# Patient Record
Sex: Female | Born: 1985 | Race: White | Hispanic: No | Marital: Married | Smoking: Never smoker
Health system: Southern US, Community
[De-identification: ages and names within clinical notes are randomized; demographics above are authoritative.]

## PROBLEM LIST (undated history)

## (undated) ENCOUNTER — Inpatient Hospital Stay: Payer: Self-pay

## (undated) DIAGNOSIS — Z789 Other specified health status: Secondary | ICD-10-CM

## (undated) HISTORY — PX: NO PAST SURGERIES: SHX2092

## (undated) HISTORY — DX: Other specified health status: Z78.9

---

## 2007-10-18 ENCOUNTER — Ambulatory Visit: Payer: Self-pay | Admitting: Pediatrics

## 2009-03-26 DIAGNOSIS — D239 Other benign neoplasm of skin, unspecified: Secondary | ICD-10-CM

## 2009-03-26 HISTORY — DX: Other benign neoplasm of skin, unspecified: D23.9

## 2012-12-26 ENCOUNTER — Inpatient Hospital Stay: Payer: Self-pay | Admitting: Obstetrics and Gynecology

## 2012-12-26 LAB — CBC WITH DIFFERENTIAL/PLATELET
Basophil #: 0 10*3/uL (ref 0.0–0.1)
Basophil %: 0.2 %
Eosinophil #: 0 10*3/uL (ref 0.0–0.7)
Eosinophil %: 0.3 %
HCT: 32.5 % — ABNORMAL LOW (ref 35.0–47.0)
Lymphocyte %: 11.3 %
MCHC: 34.2 g/dL (ref 32.0–36.0)
Monocyte #: 0.4 x10 3/mm (ref 0.2–0.9)
Monocyte %: 3.7 %
Neutrophil #: 8.6 10*3/uL — ABNORMAL HIGH (ref 1.4–6.5)
RBC: 3.79 10*6/uL — ABNORMAL LOW (ref 3.80–5.20)
RDW: 15.5 % — ABNORMAL HIGH (ref 11.5–14.5)
WBC: 10.2 10*3/uL (ref 3.6–11.0)

## 2012-12-26 LAB — GC/CHLAMYDIA PROBE AMP

## 2012-12-28 LAB — HEMATOCRIT: HCT: 33.5 % — ABNORMAL LOW (ref 35.0–47.0)

## 2014-11-20 NOTE — H&P (Signed)
L&D Evaluation:  History:  HPI 29 yo G1 at [redacted]w[redacted]d by [facility] of 12/22/2012 presenting to clinic with SROM, clear, at 10:30 AM this morning.  GBS negative.  +FM, no VB, mild ctx. CVX in clinic 2/75/-2  A+ / ABSC neg / RI / VZI / HBsAg neg / RPR NR / HIV neg / 1-hr OGTT 117 / GBS neg  TDAP 10/31/12   Patient's Medical History Tonsillectomy   Patient's Surgical History none   Medications Pre Natal Vitamins   Allergies Augmentin (hives), Ceclor (hives)   Social History none   Family History Non-Contributory   ROS:  ROS All systems were reviewed.  HEENT, CNS, GI, GU, Respiratory, CV, Renal and Musculoskeletal systems were found to be normal.   Exam:  Vital Signs stable   Urine Protein not completed   General no apparent distress   Mental Status clear   Chest no increased work o breathing   Abdomen gravid, non-tender   Estimated Fetal Weight Average for gestational age   Fetal Position vtx   Back no CVAT   Edema no edema   Pelvic no external lesions, 2/75/-2 in clinic   Mebranes Ruptured   Description clear   FHT normal rate with no decels   Ucx irregular   Impression:  Impression active labor   Plan:  Plan monitor contractions and for cervical change   Comments - if no increase in contractions or cervical change will start pitocin augmentation   Electronic Signatures: Dorthula Nettles (MD)  (Signed 16-Jun-14 13:28)  Authored: L&D Evaluation   Last Updated: 16-Jun-14 13:28 by Dorthula Nettles (MD)

## 2015-07-14 NOTE — L&D Delivery Note (Addendum)
Delivery Note At 12:06 PM a viable female was delivered via Vaginal, Spontaneous Delivery (Presentation:OP;ROP).   APGAR: 8,8; weight: 3470. Placenta status: Intact, Spontaneous.  Cord:  with the following complications: none.  Cord pH: NA  Called to see patient.  Mom pushed to delivery viable female infant.  The head followed by shoulders, which delivered without difficulty, and the rest of the body.  No nuchal cord noted.  Baby to mom's chest.  Cord clamped and cut after > 5 min delay.  No cord blood obtained.  Placenta delivered spontaneously, intact, with a 3-vessel cord.  Perineum intact.  All counts correct.  Hemostasis obtained with IV pitocin and fundal massage. EBL 350 mL.    Anesthesia: Epidural  Episiotomy:  none Lacerations:  none Suture Repair: NA Est. Blood Loss (mL):  350  Mom to postpartum.  Baby to Couplet care / Skin to Skin.   Rod Can, CNM

## 2015-12-23 ENCOUNTER — Observation Stay
Admission: EM | Admit: 2015-12-23 | Discharge: 2015-12-23 | Disposition: A | Discharge status: Home / Self Care | Payer: BC Managed Care – PPO | Attending: Obstetrics & Gynecology | Admitting: Obstetrics & Gynecology

## 2015-12-23 DIAGNOSIS — Z3A39 39 weeks gestation of pregnancy: Secondary | ICD-10-CM | POA: Insufficient documentation

## 2015-12-23 DIAGNOSIS — O163 Unspecified maternal hypertension, third trimester: Secondary | ICD-10-CM | POA: Diagnosis not present

## 2015-12-23 DIAGNOSIS — O169 Unspecified maternal hypertension, unspecified trimester: Secondary | ICD-10-CM | POA: Diagnosis present

## 2015-12-23 LAB — COMPREHENSIVE METABOLIC PANEL
ALBUMIN: 3.1 g/dL — AB (ref 3.5–5.0)
ALK PHOS: 114 U/L (ref 38–126)
ALT: 15 U/L (ref 14–54)
ANION GAP: 9 (ref 5–15)
AST: 21 U/L (ref 15–41)
BILIRUBIN TOTAL: 0.5 mg/dL (ref 0.3–1.2)
BUN: 9 mg/dL (ref 6–20)
CO2: 19 mmol/L — AB (ref 22–32)
Calcium: 9.1 mg/dL (ref 8.9–10.3)
Chloride: 107 mmol/L (ref 101–111)
Creatinine, Ser: 0.61 mg/dL (ref 0.44–1.00)
GFR calc non Af Amer: >60 mL/min (ref >60)
GLUCOSE: 94 mg/dL (ref 65–99)
Potassium: 3.8 mmol/L (ref 3.5–5.1)
SODIUM: 135 mmol/L (ref 135–145)
Total Protein: 6.1 g/dL — ABNORMAL LOW (ref 6.5–8.1)

## 2015-12-23 LAB — TYPE AND SCREEN
ABO/RH(D): A POS
ANTIBODY SCREEN: NEGATIVE

## 2015-12-23 LAB — PROTEIN / CREATININE RATIO, URINE
Creatinine, Urine: 117 mg/dL
Protein Creatinine Ratio: 0.1 mg/mg{Cre} (ref 0.00–0.15)
TOTAL PROTEIN, URINE: 12 mg/dL

## 2015-12-23 LAB — CBC
HEMATOCRIT: 32.9 % — AB (ref 35.0–47.0)
HEMOGLOBIN: 11.3 g/dL — AB (ref 12.0–16.0)
MCH: 30.3 pg (ref 26.0–34.0)
MCHC: 34.4 g/dL (ref 32.0–36.0)
MCV: 88.1 fL (ref 80.0–100.0)
Platelets: 251 10*3/uL (ref 150–440)
RBC: 3.73 MIL/uL — AB (ref 3.80–5.20)
RDW: 14.2 % (ref 11.5–14.5)
WBC: 10.8 10*3/uL (ref 3.6–11.0)

## 2015-12-23 MED ORDER — ACETAMINOPHEN 325 MG PO TABS: 650.0000 mg | ORAL_TABLET | ORAL | Status: DC | PRN

## 2015-12-23 MED ORDER — ONDANSETRON HCL 4 MG/2ML IJ SOLN: 4.0000 mg | Freq: Four times a day (QID) | INTRAMUSCULAR | Status: DC | PRN

## 2015-12-23 NOTE — OB Triage Note (Signed)
Kathleen Marsh sent over from office for pre-eclampsia evaluation.

## 2015-12-23 NOTE — Discharge Summary (Signed)
See FPN

## 2015-12-23 NOTE — Discharge Instructions (Signed)

## 2015-12-23 NOTE — Final Progress Note (Signed)
Physician Final Progress Note  Patient ID: EWA KOT MRN: [id number] DOB/AGE: [birth year] 28 y.o.  Admit date: 12/23/2015 Admitting provider: Gae Dry, MD Discharge date: 12/23/2015  Admission Diagnoses: Elevated BP in office  Discharge Diagnoses:  Principal Problem:   Hypertension affecting pregnancy    Consults: None  Significant Findings/ Diagnostic Studies: 30 yo G1 at 39 weeks with elevated BP in office (140/88); she reports occas headache (mild treated well w Tylenol and rest) and spotty vision at times.  No CP, SOB, epig pain, or edema.  Today was last day at work [employer]), prior to office visit.  No s/sx labor.  AF, BP 115/68, 120s/70s at most.   Chest clear   Heart reg   Abd gravid NT ND   Extr no edema   SVE 1-2/80/-3   FHT 140s  Labs- no findings c/w preclampsia  Procedures: A NST procedure was performed with FHR monitoring and a normal baseline established, appropriate time of 20-40 minutes of evaluation, and accels >2 seen w 15x15 characteristics.  Results show a REACTIVE NST.   Discharge Condition: good  Disposition:  No signs of preclampsia; labile BP elevation due to work/other factors Monitor for sx's of preclampsia; labor signs counseled as well.   Diet: Regular diet  Discharge Activity: Activity as tolerated     Medication List    Notice    You have not been prescribed any medications.       Total time spent taking care of this patient: 15 minutes  Signed: Hoyt Koch 12/23/2015, 3:26 PM

## 2015-12-27 ENCOUNTER — Inpatient Hospital Stay: Payer: BC Managed Care – PPO | Admitting: Certified Registered Nurse Anesthetist

## 2015-12-27 ENCOUNTER — Inpatient Hospital Stay
Admission: EM | Admit: 2015-12-27 | Discharge: 2015-12-28 | DRG: 775 | Disposition: A | Discharge status: Home / Self Care | Payer: BC Managed Care – PPO | Attending: Advanced Practice Midwife | Admitting: Advanced Practice Midwife

## 2015-12-27 DIAGNOSIS — O4290 Premature rupture of membranes, unspecified as to length of time between rupture and onset of labor, unspecified weeks of gestation: Secondary | ICD-10-CM | POA: Diagnosis present

## 2015-12-27 DIAGNOSIS — Z888 Allergy status to other drugs, medicaments and biological substances status: Secondary | ICD-10-CM

## 2015-12-27 DIAGNOSIS — O4202 Full-term premature rupture of membranes, onset of labor within 24 hours of rupture: Secondary | ICD-10-CM | POA: Diagnosis present

## 2015-12-27 DIAGNOSIS — Z3A40 40 weeks gestation of pregnancy: Secondary | ICD-10-CM | POA: Diagnosis not present

## 2015-12-27 LAB — CBC
HEMATOCRIT: 33.7 % — AB (ref 35.0–47.0)
Hemoglobin: 11.6 g/dL — ABNORMAL LOW (ref 12.0–16.0)
MCH: 30.6 pg (ref 26.0–34.0)
MCHC: 34.5 g/dL (ref 32.0–36.0)
MCV: 88.7 fL (ref 80.0–100.0)
Platelets: 253 10*3/uL (ref 150–440)
RBC: 3.79 MIL/uL — AB (ref 3.80–5.20)
RDW: 14.3 % (ref 11.5–14.5)
WBC: 11 10*3/uL (ref 3.6–11.0)

## 2015-12-27 LAB — TYPE AND SCREEN
ABO/RH(D): A POS
Antibody Screen: NEGATIVE

## 2015-12-27 MED ORDER — OXYTOCIN BOLUS FROM INFUSION: 500.0000 mL | INTRAVENOUS | Status: DC

## 2015-12-27 MED ORDER — SENNOSIDES-DOCUSATE SODIUM 8.6-50 MG PO TABS
2.0000 | ORAL_TABLET | ORAL | Status: DC
  Administered 2015-12-27: 2 via ORAL
  Filled 2015-12-27: qty 2

## 2015-12-27 MED ORDER — SOD CITRATE-CITRIC ACID 500-334 MG/5ML PO SOLN: 30.0000 mL | ORAL | Status: DC | PRN

## 2015-12-27 MED ORDER — OXYTOCIN 10 UNIT/ML IJ SOLN
INTRAMUSCULAR | Status: AC
  Filled 2015-12-27: qty 2

## 2015-12-27 MED ORDER — DIPHENHYDRAMINE HCL 25 MG PO CAPS: 25.0000 mg | ORAL_CAPSULE | Freq: Four times a day (QID) | ORAL | Status: DC | PRN

## 2015-12-27 MED ORDER — OXYTOCIN 40 UNITS IN LACTATED RINGERS INFUSION - SIMPLE MED
1.0000 m[IU]/min | INTRAVENOUS | Status: DC
  Administered 2015-12-27: 2 m[IU]/min via INTRAVENOUS

## 2015-12-27 MED ORDER — LIDOCAINE HCL (PF) 1 % IJ SOLN: 30.0000 mL | INTRAMUSCULAR | Status: DC | PRN

## 2015-12-27 MED ORDER — BUTORPHANOL TARTRATE 1 MG/ML IJ SOLN: 1.0000 mg | INTRAMUSCULAR | Status: DC | PRN

## 2015-12-27 MED ORDER — LIDOCAINE-EPINEPHRINE (PF) 1.5 %-1:200000 IJ SOLN
INTRAMUSCULAR | Status: DC | PRN
  Administered 2015-12-27: 3 mL via EPIDURAL

## 2015-12-27 MED ORDER — ONDANSETRON HCL 4 MG PO TABS: 4.0000 mg | ORAL_TABLET | ORAL | Status: DC | PRN

## 2015-12-27 MED ORDER — COCONUT OIL OIL: 1.0000 "application " | TOPICAL_OIL | Status: DC | PRN

## 2015-12-27 MED ORDER — DIBUCAINE 1 % RE OINT: 1.0000 "application " | TOPICAL_OINTMENT | RECTAL | Status: DC | PRN

## 2015-12-27 MED ORDER — EPHEDRINE 5 MG/ML INJ
10.0000 mg | INTRAVENOUS | Status: DC | PRN
Start: 2015-12-27 — End: 2015-12-27
  Filled 2015-12-27: qty 2

## 2015-12-27 MED ORDER — BUPIVACAINE HCL (PF) 0.25 % IJ SOLN
INTRAMUSCULAR | Status: DC | PRN
  Administered 2015-12-27 (×2): 4 mL via EPIDURAL

## 2015-12-27 MED ORDER — MISOPROSTOL 200 MCG PO TABS
ORAL_TABLET | ORAL | Status: AC
  Filled 2015-12-27: qty 4

## 2015-12-27 MED ORDER — LACTATED RINGERS IV SOLN
500.0000 mL | INTRAVENOUS | Status: DC | PRN
  Administered 2015-12-27: 500 mL via INTRAVENOUS

## 2015-12-27 MED ORDER — LIDOCAINE HCL (PF) 1 % IJ SOLN
INTRAMUSCULAR | Status: AC
  Filled 2015-12-27: qty 30

## 2015-12-27 MED ORDER — FENTANYL 2.5 MCG/ML W/ROPIVACAINE 0.2% IN NS 100 ML EPIDURAL INFUSION (ARMC-ANES)
EPIDURAL | Status: AC
  Filled 2015-12-27: qty 100

## 2015-12-27 MED ORDER — OXYTOCIN 40 UNITS IN LACTATED RINGERS INFUSION - SIMPLE MED
2.5000 [IU]/h | INTRAVENOUS | Status: DC
  Filled 2015-12-27 (×2): qty 1000

## 2015-12-27 MED ORDER — BENZOCAINE-MENTHOL 20-0.5 % EX AERO: 1.0000 "application " | INHALATION_SPRAY | CUTANEOUS | Status: DC | PRN

## 2015-12-27 MED ORDER — SIMETHICONE 80 MG PO CHEW: 80.0000 mg | CHEWABLE_TABLET | ORAL | Status: DC | PRN

## 2015-12-27 MED ORDER — ONDANSETRON HCL 4 MG/2ML IJ SOLN: 4.0000 mg | Freq: Four times a day (QID) | INTRAMUSCULAR | Status: DC | PRN

## 2015-12-27 MED ORDER — TETANUS-DIPHTH-ACELL PERTUSSIS 5-2.5-18.5 LF-MCG/0.5 IM SUSP: 0.5000 mL | Freq: Once | INTRAMUSCULAR | Status: DC

## 2015-12-27 MED ORDER — DEXTROSE 5 % IV SOLN
5.0000 10*6.[IU] | Freq: Once | INTRAVENOUS | Status: AC
  Administered 2015-12-27: 5 10*6.[IU] via INTRAVENOUS
  Filled 2015-12-27: qty 5

## 2015-12-27 MED ORDER — OXYCODONE HCL 5 MG PO TABS: 10.0000 mg | ORAL_TABLET | ORAL | Status: DC | PRN

## 2015-12-27 MED ORDER — AMMONIA AROMATIC IN INHA
RESPIRATORY_TRACT | Status: AC
  Filled 2015-12-27: qty 10

## 2015-12-27 MED ORDER — PRENATAL MULTIVITAMIN CH
1.0000 | ORAL_TABLET | Freq: Every day | ORAL | Status: DC
  Administered 2015-12-28: 1 via ORAL
  Filled 2015-12-27: qty 1

## 2015-12-27 MED ORDER — ACETAMINOPHEN 325 MG PO TABS: 650.0000 mg | ORAL_TABLET | ORAL | Status: DC | PRN

## 2015-12-27 MED ORDER — WITCH HAZEL-GLYCERIN EX PADS: 1.0000 "application " | MEDICATED_PAD | CUTANEOUS | Status: DC | PRN

## 2015-12-27 MED ORDER — LACTATED RINGERS IV SOLN
500.0000 mL | Freq: Once | INTRAVENOUS | Status: DC
Start: 2015-12-27 — End: 2015-12-27

## 2015-12-27 MED ORDER — FENTANYL 2.5 MCG/ML W/ROPIVACAINE 0.2% IN NS 100 ML EPIDURAL INFUSION (ARMC-ANES): 10.0000 mL/h | EPIDURAL | Status: DC

## 2015-12-27 MED ORDER — PENICILLIN G POTASSIUM 5000000 UNITS IJ SOLR
2.5000 10*6.[IU] | INTRAVENOUS | Status: DC
  Administered 2015-12-27 (×2): 2.5 10*6.[IU] via INTRAVENOUS
  Filled 2015-12-27 (×9): qty 2.5

## 2015-12-27 MED ORDER — TERBUTALINE SULFATE 1 MG/ML IJ SOLN: 0.2500 mg | Freq: Once | INTRAMUSCULAR | Status: DC | PRN

## 2015-12-27 MED ORDER — EPHEDRINE 5 MG/ML INJ
10.0000 mg | INTRAVENOUS | Status: DC | PRN
  Filled 2015-12-27: qty 2

## 2015-12-27 MED ORDER — IBUPROFEN 600 MG PO TABS
600.0000 mg | ORAL_TABLET | Freq: Four times a day (QID) | ORAL | Status: DC
  Administered 2015-12-27 – 2015-12-28 (×4): 600 mg via ORAL
  Filled 2015-12-27 (×4): qty 1

## 2015-12-27 MED ORDER — PHENYLEPHRINE 40 MCG/ML (10ML) SYRINGE FOR IV PUSH (FOR BLOOD PRESSURE SUPPORT)
80.0000 ug | PREFILLED_SYRINGE | INTRAVENOUS | Status: DC | PRN
  Filled 2015-12-27: qty 5

## 2015-12-27 MED ORDER — LIDOCAINE HCL (PF) 1 % IJ SOLN
INTRAMUSCULAR | Status: DC | PRN
  Administered 2015-12-27: 3 mL via SUBCUTANEOUS

## 2015-12-27 MED ORDER — PHENYLEPHRINE 40 MCG/ML (10ML) SYRINGE FOR IV PUSH (FOR BLOOD PRESSURE SUPPORT)
80.0000 ug | PREFILLED_SYRINGE | INTRAVENOUS | Status: DC | PRN
Start: 2015-12-27 — End: 2015-12-27
  Filled 2015-12-27: qty 5

## 2015-12-27 MED ORDER — FENTANYL 2.5 MCG/ML W/ROPIVACAINE 0.2% IN NS 100 ML EPIDURAL INFUSION (ARMC-ANES)
EPIDURAL | Status: DC | PRN
  Administered 2015-12-27: 9 mL/h via EPIDURAL

## 2015-12-27 MED ORDER — OXYCODONE HCL 5 MG PO TABS: 5.0000 mg | ORAL_TABLET | ORAL | Status: DC | PRN

## 2015-12-27 MED ORDER — LACTATED RINGERS IV SOLN
INTRAVENOUS | Status: DC
  Administered 2015-12-27: 03:00:00 via INTRAVENOUS

## 2015-12-27 MED ORDER — ONDANSETRON HCL 4 MG/2ML IJ SOLN: 4.0000 mg | INTRAMUSCULAR | Status: DC | PRN

## 2015-12-27 NOTE — H&P (Addendum)
Obstetric H&P   Chief Complaint: Leaking fluid  Prenatal Care Provider: [facility]  History of Present Illness: 30 y.o. G1P0 [redacted]w[redacted]d by 8 week [place] derived EDC of 12/25/2015, presenting with clear SROM at 0100, some mild contractions since.  Had PROM with G1 pregancy.  +FM, no VB  A pos / RI / VZI / HIV neg / RPR NR / HBsAg neg / Panorama XX / Elevated 1-hr at 183 (3-hr all values normal) / GBS bacteruria.    History of macrosomia with G1 pregnancy, pelvis tested to 9lbs 11oz, 40lbs weight gain this pregnancy, 45lbs weight gain with G1 pregnancy.  Growth scan on 12/02/2015 at [redacted]w[redacted]d 2892g or 6lbs 6oz c/w [id number]  PNC uncomplicated this pregnancy, some elevated pressures in 39th week in clinic negative follow up and work up on L&D  Review of Systems: 10 point review of systems negative unless otherwise noted in HPI  Past Medical History: No past medical history on file.  Past Surgical History: No past surgical history on file.  Family History: No family history on file.  Social History: Social History   Social History  . Marital Status: Married    Spouse Name: N/A  . Number of Children: N/A  . Years of Education: N/A   Occupational History  . Not on file.   Social History Main Topics  . Smoking status: Not on file  . Smokeless tobacco: Not on file  . Alcohol Use: Not on file  . Drug Use: Not on file  . Sexual Activity: Not on file   Other Topics Concern  . Not on file   Social History Narrative  . No narrative on file    Medications: Prior to Admission medications   Not on File    Allergies: Allergies  Allergen Reactions  . Augmentin [Amoxicillin-Pot Clavulanate] Diarrhea  . Ceclor [Cefaclor] Hives    Physical Exam: Vitals: Blood pressure 125/79, pulse 107, temperature 98.7 F (37.1 C), temperature source Oral, resp. rate 16, last menstrual period 03/20/2015.  FHT: 130, moderate, +accels, no decels Toco: q3-79min  General: NAD HEENT:normocephalic,  anicteric Pulmonary: no increased work of breathig Abdomen: Gravid, non-tender Leopolds: vtx Genitourinary: Dilation: 3 Effacement (%): 80 Station: -2 Presentation: Vertex Exam by:: MBS  MSK: normal Extremities: no edema, erythema or tendernss  Labs: No results found for this or any previous visit (from the past 24 hour(s)).  Assessment: 30 y.o. G1P0 [redacted]w[redacted]d by 12/25/2015, with SROM  Plan: 1) SROM - expectant management, if contractions do not pick up in intensity in the next hour will start pitocin augmentation  2) Fetus - cat I tracing  3) PNL - A pos / RI / VZI / HIV neg / RPR NR / HBsAg neg / Elevated 1-hr at 183 (3-hr all values normal) / GBS bacteruria- PCN for GBS ppx  4) TDAP - 11/06/2015  5) Disposition - pending delivery

## 2015-12-27 NOTE — Plan of Care (Signed)
Pt up to bedside commode. Voided large amount. pericare given. Pt assisted into wheelchair and transferred to room 341 and report given.

## 2015-12-27 NOTE — Anesthesia Procedure Notes (Signed)
Epidural Patient location during procedure: OB Start time: 12/27/2015 10:40 AM End time: 12/27/2015 10:55 AM  Staffing Anesthesiologist: Gunnar Bulla Resident/CRNA: Demetrius Charity Performed by: resident/CRNA   Preanesthetic Checklist Completed: patient identified, site marked, surgical consent, pre-op evaluation, timeout performed, IV checked, risks and benefits discussed and monitors and equipment checked  Epidural Patient position: sitting Prep: Betadine Patient monitoring: heart rate, continuous pulse ox and blood pressure Approach: midline Location: L4-L5 Injection technique: LOR saline  Needle:  Needle type: Tuohy  Needle gauge: 17 G Needle length: 9 cm and 9 Needle insertion depth: 5 cm Catheter type: closed end flexible Catheter size: 19 Gauge Catheter at skin depth: 10 cm Test dose: negative and 1.5% lidocaine with Epi 1:200 K  Assessment Sensory level: T10 Events: blood not aspirated, injection not painful, no injection resistance, negative IV test and no paresthesia  Additional Notes Pt. Evaluated and documentation done after procedure finished. Patient identified. Risks/Benefits/Options discussed with patient including but not limited to bleeding, infection, nerve damage, paralysis, failed block, incomplete pain control, headache, blood pressure changes, nausea, vomiting, reactions to medication both or allergic, itching and postpartum back pain. Confirmed with bedside nurse the patient's most recent platelet count. Confirmed with patient that they are not currently taking any anticoagulation, have any bleeding history or any family history of bleeding disorders. Patient expressed understanding and wished to proceed. All questions were answered. Sterile technique was used throughout the entire procedure. Please see nursing notes for vital signs. Test dose was given through epidural catheter and negative prior to continuing to dose epidural or start infusion. Warning  signs of high block given to the patient including shortness of breath, tingling/numbness in hands, complete motor block, or any concerning symptoms with instructions to call for help. Patient was given instructions on fall risk and not to get out of bed. All questions and concerns addressed with instructions to call with any issues or inadequate analgesia.   Patient tolerated the insertion well without immediate complications.Reason for block:procedure for pain

## 2015-12-27 NOTE — Plan of Care (Signed)
Fundal check done. Pt states her left leg is still numb and unable to move. Will continue to monitor

## 2015-12-27 NOTE — Discharge Summary (Signed)
OB Discharge Summary  Patient Name: Kathleen Marsh DOB: 1986-03-03 MRN: [id number]  Date of admission: 12/27/2015 Delivering MD: Rod Can, CNM Date of Delivery: 12/27/2015  Date of discharge: 12/28/15  Admitting diagnosis: G3P1011 at 104w2d with Premature Rupture of Membranes  Intrauterine pregnancy: [redacted]w[redacted]d     Secondary diagnosis: None     Discharge diagnosis: Term Pregnancy Delivered                                                                                                Post partum procedures:none  Augmentation: Pitocin  Complications: None  Hospital course:  Induction of Labor With Vaginal Delivery   30 y.o. yo G3P1011 at [redacted]w[redacted]d was admitted to the hospital 12/27/2015 for labor.   Indication for delivery: Favorable cervix at term and PROM.   Patient had an uncomplicated labor course as follows: Membrane Rupture Time/Date: 1:00 AM ,12/27/2015   Intrapartum Procedures: none Episiotomy: none Lacerations: none Patient had a spontaneous vaginal delivery of a Viable female infant 12/27/2015.   Details of delivery can be found in separate delivery note.    Patient had a routine postpartum course.  Physical exam  Filed Vitals:   12/27/15 1731 12/27/15 1932 12/27/15 2325 12/28/15 0300  BP: 120/62 114/69 110/69 110/61  Pulse: 108 85 97 73  Temp: 99.1 F (37.3 C) 98.2 F (36.8 C) 98.4 F (36.9 C) 98.8 F (37.1 C)  TempSrc: Oral Oral Oral Oral  Resp: 16 18 20 20   Height:      Weight:      SpO2: 98%  97% 97%   General: alert, cooperative and no distress Lochia: appropriate Uterine Fundus: firm Incision: N/A DVT Evaluation: No evidence of DVT seen on physical exam.  Labs: Lab Results  Component Value Date   WBC 12.4* 12/28/2015   HGB 9.9* 12/28/2015   HCT 29.4* 12/28/2015   MCV 90.7 12/28/2015   PLT 252 12/28/2015   Discharge instruction: per After Visit Summary.  Medications:    Medication List    TAKE these medications        multivitamin-prenatal 27-0.8 MG Tabs tablet  Take 1 tablet by mouth daily at 12 noon.         Motrin  As needed for pain   Diet: routine diet  Activity: Advance as tolerated. Pelvic rest for 6 weeks.   Outpatient follow up: Follow-up Information    Follow up with Hoyt Koch, MD. Schedule an appointment as soon as possible for a visit in 6 weeks.   Specialty:  Obstetrics and Gynecology   Why:  For Post Partum Check; also schedule/order NEXPLANON if desired for birth control   Contact information:   [address] [place] [address] [phone]        Postpartum contraception: Nexplanon or pills Rhogam Given postpartum: NA Rubella vaccine given postpartum: Rubella Immune Varicella vaccine given postpartum: Varicella Immune TDaP given antepartum or postpartum: given antepartum  Newborn Data: Live born female  Birth Weight: 3470 APGAR: 8, 8 Baby Feeding: Breast Disposition:home with mother   Hoyt Koch, MD

## 2015-12-27 NOTE — Anesthesia Preprocedure Evaluation (Addendum)
Anesthesia Evaluation  Patient identified by MRN, date of birth, ID band Patient awake    Reviewed: Allergy & Precautions, NPO status , Patient's Chart, lab work & pertinent test results, reviewed documented beta blocker date and time   Airway Mallampati: II  TM Distance: >3 FB Neck ROM: Full    Dental  (+) Dental Advisory Given, Teeth Intact   Pulmonary           Cardiovascular hypertension,      Neuro/Psych    GI/Hepatic GERD  Controlled,  Endo/Other    Renal/GU      Musculoskeletal   Abdominal   Peds  Hematology  (+) anemia ,   Anesthesia Other Findings Hb 11.7.  Reproductive/Obstetrics (+) Pregnancy                           Anesthesia Physical Anesthesia Plan  ASA: II  Anesthesia Plan: Epidural   Post-op Pain Management:    Induction: Intravenous  Airway Management Planned: Oral ETT  Additional Equipment:   Intra-op Plan:   Post-operative Plan:   Informed Consent: I have reviewed the patients History and Physical, chart, labs and discussed the procedure including the risks, benefits and alternatives for the proposed anesthesia with the patient or authorized representative who has indicated his/her understanding and acceptance.     Plan Discussed with: CRNA and Anesthesiologist  Anesthesia Plan Comments:        Anesthesia Quick Evaluation

## 2015-12-28 LAB — CBC
HEMATOCRIT: 29.4 % — AB (ref 35.0–47.0)
Hemoglobin: 9.9 g/dL — ABNORMAL LOW (ref 12.0–16.0)
MCH: 30.5 pg (ref 26.0–34.0)
MCHC: 33.6 g/dL (ref 32.0–36.0)
MCV: 90.7 fL (ref 80.0–100.0)
Platelets: 252 10*3/uL (ref 150–440)
RBC: 3.24 MIL/uL — AB (ref 3.80–5.20)
RDW: 14.5 % (ref 11.5–14.5)
WBC: 12.4 10*3/uL — AB (ref 3.6–11.0)

## 2015-12-28 LAB — RPR: RPR Ser Ql: NONREACTIVE

## 2015-12-28 NOTE — Progress Notes (Signed)
Admit Date: 12/27/2015 Today's Date: 12/28/2015  Post Partum Day 1  Subjective:  no complaints, up ad lib, voiding and tolerating PO  Objective: Temp:  [98.2 F (36.8 C)-99.1 F (37.3 C)] 98.8 F (37.1 C) (06/17 0300) Pulse Rate:  [73-120] 73 (06/17 0300) Resp:  [16-20] 20 (06/17 0300) BP: (83-125)/(41-88) 110/61 mmHg (06/17 0300) SpO2:  [95 %-98 %] 97 % (06/17 0300)  Physical Exam:  General: alert, cooperative and no distress Lochia: appropriate Uterine Fundus: firm Incision: none DVT Evaluation: No evidence of DVT seen on physical exam.   Recent Labs  12/27/15 0230 12/28/15 0639  HGB 11.6* 9.9*  HCT 33.7* 29.4*    Assessment/Plan: Discharge home, Breastfeeding and Infant doing well   LOS: 1 day   [place] 12/28/2015, 8:57 AM

## 2015-12-28 NOTE — Discharge Instructions (Signed)

## 2015-12-28 NOTE — Progress Notes (Signed)
Patient understands all discharge instructions and the need to make follow up appointments. Patient discharge via wheelchair with RN.

## 2015-12-30 NOTE — Anesthesia Postprocedure Evaluation (Signed)
Anesthesia Post Note  Patient: Annete Jeremy Stage  Procedure(s) Performed: * No procedures listed *  Patient location during evaluation: Other Anesthesia Type: General Level of consciousness: awake and alert Pain management: pain level controlled Vital Signs Assessment: post-procedure vital signs reviewed and stable Respiratory status: spontaneous breathing, nonlabored ventilation, respiratory function stable and patient connected to nasal cannula oxygen Cardiovascular status: blood pressure returned to baseline and stable Postop Assessment: no signs of nausea or vomiting Anesthetic complications: no    Last Vitals: There were no vitals filed for this visit.  Last Pain: There were no vitals filed for this visit.               Aundray Cartlidge S

## 2016-01-02 MED ORDER — OXYTOCIN 40 UNITS IN LACTATED RINGERS INFUSION - SIMPLE MED
INTRAVENOUS | Status: AC
  Filled 2016-01-02: qty 1000

## 2016-09-17 ENCOUNTER — Telehealth: Payer: Self-pay

## 2016-09-17 NOTE — Telephone Encounter (Signed)
Pt has been on Ortho tri cyclen lo for about 6 months.  A couple of weeks ago she started feeling bad,very tired, and started spotting. She has never spotted in between periods before.

## 2016-09-17 NOTE — Telephone Encounter (Signed)
Could be from many things.  Would cont to take pills daily without change, but of this becomes a pattern of spotting or breakthru bleeding then may need to change.

## 2016-09-17 NOTE — Telephone Encounter (Signed)
Please advise 

## 2016-09-18 NOTE — Telephone Encounter (Signed)
Called pt no answer no voice mailbox set up

## 2016-09-22 NOTE — Telephone Encounter (Signed)
Patient left msg on triage returning your call.

## 2016-09-22 NOTE — Telephone Encounter (Signed)
Pt still not answering call and no voice mailbox set up

## 2017-01-16 ENCOUNTER — Other Ambulatory Visit: Payer: Self-pay | Admitting: Obstetrics & Gynecology

## 2017-01-18 ENCOUNTER — Other Ambulatory Visit: Payer: Self-pay | Admitting: Family Medicine

## 2017-01-18 DIAGNOSIS — M79662 Pain in left lower leg: Secondary | ICD-10-CM

## 2017-01-19 ENCOUNTER — Telehealth: Payer: Self-pay | Admitting: Obstetrics & Gynecology

## 2017-01-19 NOTE — Telephone Encounter (Signed)
-----   Message from Gae Dry, MD sent at 01/18/2017  7:49 AM EDT ----- Regarding: Annual [place] Annual in Aug w Kenton Kingfisher. eRx done for pill.

## 2017-01-19 NOTE — Telephone Encounter (Signed)
Called and left voice mail for patient to call back to be schedule °

## 2017-01-20 ENCOUNTER — Ambulatory Visit: Payer: BC Managed Care – PPO

## 2017-02-16 ENCOUNTER — Ambulatory Visit (INDEPENDENT_AMBULATORY_CARE_PROVIDER_SITE_OTHER): Payer: BC Managed Care – PPO | Admitting: Obstetrics and Gynecology

## 2017-02-16 ENCOUNTER — Encounter: Payer: Self-pay | Admitting: Obstetrics and Gynecology

## 2017-02-16 VITALS — BP 118/70 | Ht 67.0 in | Wt 181.0 lb

## 2017-02-16 DIAGNOSIS — Z3041 Encounter for surveillance of contraceptive pills: Secondary | ICD-10-CM

## 2017-02-16 DIAGNOSIS — Z01419 Encounter for gynecological examination (general) (routine) without abnormal findings: Secondary | ICD-10-CM

## 2017-02-16 DIAGNOSIS — Z1389 Encounter for screening for other disorder: Secondary | ICD-10-CM | POA: Diagnosis not present

## 2017-02-16 DIAGNOSIS — Z1331 Encounter for screening for depression: Secondary | ICD-10-CM

## 2017-02-16 DIAGNOSIS — Z1339 Encounter for screening examination for other mental health and behavioral disorders: Secondary | ICD-10-CM

## 2017-02-16 MED ORDER — NORGESTIM-ETH ESTRAD TRIPHASIC 0.18/0.215/0.25 MG-25 MCG PO TABS: 1.0000 | ORAL_TABLET | Freq: Every day | ORAL | 4 refills | Status: DC

## 2017-02-16 NOTE — Progress Notes (Signed)
Gynecology Annual Exam  PCP: Adin Hector, MD  Chief Complaint  Patient presents with  . Annual Exam   History of Present Illness:  [name] is a 31 y.o. L2G4010 who LMP was Patient's last menstrual period was 02/11/2017., presents today for her annual examination.  Her menses are regular every 28-30 days, lasting 5 day(s).  Dysmenorrhea mild, occurring first 1-2 days of flow. She does not have intermenstrual bleeding.  She is single partner, contraception - OCP (estrogen/progesterone).  Last Pap: 01/2016, NILM, no HPV due to age Hx of STDs: none  There is no FH of breast cancer. There is no FH of ovarian cancer. The patient does not do self-breast exams.  Tobacco use: The patient denies current or previous tobacco use. Alcohol use: none Exercise: not active  The patient wears seatbelts: no.   The patient reports that domestic violence in her life is absent.   Past Medical History:  Diagnosis Date  . Medical history non-contributory     Past Surgical History:  Procedure Laterality Date  . NO PAST SURGERIES      Prior to Admission medications   Medication Sig Start Date End Date Taking? Authorizing Provider  TRI-LO-MARZIA 0.18/0.215/0.25 MG-25 MCG tab TAKE 1 TABLET BY MOUTH ONCE DAILY FOR 28 DAYS 01/18/17  Yes Gae Dry, MD  Prenatal Vit-Fe Fumarate-FA (MULTIVITAMIN-PRENATAL) 27-0.8 MG TABS tablet Take 1 tablet by mouth daily at 12 noon.    [provider]    Allergies  Allergen Reactions  . Augmentin [Amoxicillin-Pot Clavulanate] Diarrhea  . Ceclor [Cefaclor] Hives    Gynecologic History: Patient's last menstrual period was 02/11/2017.  Obstetric History: [id number]  Social History   Social History  . Marital status: Married    Spouse name: N/A  . Number of children: N/A  . Years of education: N/A   Occupational History  . Not on file.   Social History Main Topics  . Smoking status: Never Smoker  . Smokeless tobacco: Not on  file  . Alcohol use No  . Drug use: No  . Sexual activity: Yes    Birth control/ protection: Pill   Other Topics Concern  . Not on file   Social History Narrative  . No narrative on file    Family History  Problem Relation Age of Onset  . Stroke Father   . Non-Hodgkin's lymphoma Maternal Aunt   . Leukemia Maternal Uncle   . Colon cancer Maternal Grandmother   . Brain cancer Maternal Grandfather   . Bladder Cancer Paternal Grandmother     Review of Systems  Constitutional: Negative.   HENT: Negative.   Eyes: Negative.   Respiratory: Negative.   Cardiovascular: Negative.   Gastrointestinal: Negative.   Genitourinary: Negative.   Musculoskeletal: Negative.   Skin: Negative.   Neurological: Negative.   Psychiatric/Behavioral: Negative.      Physical Exam BP 118/70   Ht 5\' 7"  (1.702 m)   Wt 181 lb (82.1 kg)   LMP 02/11/2017   BMI 28.35 kg/m    Physical Exam  Constitutional: She is oriented to person, place, and time. She appears well-developed and well-nourished. No distress.  Genitourinary: Vagina normal and uterus normal. Pelvic exam was performed with patient supine. There is no rash, tenderness or lesion on the right labia. There is no rash, tenderness or lesion on the left labia. Vagina exhibits no lesion. No erythema in the vagina. No signs of injury around the vagina. Right adnexum does not display  mass and does not display fullness. Left adnexum does not display mass, does not display tenderness and does not display fullness. Cervix does not exhibit motion tenderness, lesion, friability or polyp.   Uterus is mobile and anteverted. Uterus is not enlarged, tender, exhibiting a mass or irregular (is regular).  HENT:  Head: Normocephalic and atraumatic.  Eyes: EOM are normal. No scleral icterus.  Neck: Normal range of motion. Neck supple. No thyromegaly present.  Cardiovascular: Normal rate, regular rhythm and normal heart sounds.  Exam reveals no gallop and no  friction rub.   No murmur heard. Pulmonary/Chest: Effort normal and breath sounds normal. No respiratory distress. She has no wheezes. She has no rales.  Abdominal: Soft. Bowel sounds are normal. She exhibits no distension and no mass. There is no tenderness. There is no rebound and no guarding.  Musculoskeletal: Normal range of motion. She exhibits no edema.  Lymphadenopathy:    She has no cervical adenopathy.  Neurological: She is alert and oriented to person, place, and time. No cranial nerve deficit.  Skin: Skin is warm and dry. No erythema.  Psychiatric: She has a normal mood and affect. Her behavior is normal. Judgment normal.   Female chaperone present for pelvic and breast  portions of the physical exam  Results: AUDIT Questionnaire (screen for alcoholism): 0 PHQ-9: 1  Assessment: 31 y.o. G43P2011 female here for routine annual gynecologic examination  Plan: Problem List Items Addressed This Visit    None    Visit Diagnoses    Women's annual routine gynecological examination    -  Primary   Relevant Medications   Norgestimate-Ethinyl Estradiol Triphasic (TRI-LO-MARZIA) 0.18/0.215/0.25 MG-25 MCG tab   Encounter for surveillance of contraceptive pills       Relevant Medications   Norgestimate-Ethinyl Estradiol Triphasic (TRI-LO-MARZIA) 0.18/0.215/0.25 MG-25 MCG tab   Screening for depression       Screening for alcohol problem          Screening: -- Blood pressure screen normal -- Weight screening: overweight: continue to monitor -- Depression screening negative (PHQ-9) -- Nutrition: normal -- cholesterol screening: not due for screening -- osteoporosis screening: not due -- tobacco screening: not using -- alcohol screening: AUDIT questionnaire indicates low-risk usage. -- family history of breast cancer screening: done. not at high risk. -- no evidence of domestic violence or intimate partner violence. -- STD screening: gonorrhea/chlamydia NAAT not collected per  patient request. -- pap smear not collected per ASCCP guidelines -- HPV vaccination series: not eligilbe  Prentice Docker, MD 02/16/2017 11:02 AM

## 2017-10-12 ENCOUNTER — Other Ambulatory Visit
Admission: RE | Admit: 2017-10-12 | Discharge: 2017-10-12 | Disposition: A | Discharge status: Home / Self Care | Payer: BC Managed Care – PPO | Source: Ambulatory Visit | Attending: Family Medicine | Admitting: Family Medicine

## 2017-10-12 DIAGNOSIS — R000 Tachycardia, unspecified: Secondary | ICD-10-CM | POA: Diagnosis present

## 2017-10-12 LAB — FIBRIN DERIVATIVES D-DIMER (ARMC ONLY): Fibrin derivatives D-dimer (ARMC): 536 ng/mL (FEU) — ABNORMAL HIGH (ref 0.00–499.00)

## 2017-10-13 ENCOUNTER — Other Ambulatory Visit: Payer: Self-pay | Admitting: Family Medicine

## 2017-10-13 ENCOUNTER — Ambulatory Visit
Admission: RE | Admit: 2017-10-13 | Discharge: 2017-10-13 | Disposition: A | Discharge status: Home / Self Care | Payer: BC Managed Care – PPO | Source: Ambulatory Visit | Attending: Family Medicine | Admitting: Family Medicine

## 2017-10-13 DIAGNOSIS — R7989 Other specified abnormal findings of blood chemistry: Secondary | ICD-10-CM

## 2017-10-13 DIAGNOSIS — R000 Tachycardia, unspecified: Secondary | ICD-10-CM | POA: Insufficient documentation

## 2017-10-13 MED ORDER — IOHEXOL 350 MG/ML SOLN
75.0000 mL | Freq: Once | INTRAVENOUS | Status: AC | PRN
  Administered 2017-10-13: 75 mL via INTRAVENOUS

## 2018-02-22 ENCOUNTER — Ambulatory Visit (INDEPENDENT_AMBULATORY_CARE_PROVIDER_SITE_OTHER): Payer: BC Managed Care – PPO | Admitting: Obstetrics and Gynecology

## 2018-02-22 ENCOUNTER — Encounter: Payer: Self-pay | Admitting: Obstetrics and Gynecology

## 2018-02-22 ENCOUNTER — Other Ambulatory Visit (HOSPITAL_COMMUNITY)
Admission: RE | Admit: 2018-02-22 | Discharge: 2018-02-22 | Disposition: A | Discharge status: Home / Self Care | Payer: BC Managed Care – PPO | Source: Ambulatory Visit | Attending: Obstetrics and Gynecology | Admitting: Obstetrics and Gynecology

## 2018-02-22 VITALS — BP 116/68 | HR 106 | Ht 67.0 in | Wt 183.0 lb

## 2018-02-22 DIAGNOSIS — Z01419 Encounter for gynecological examination (general) (routine) without abnormal findings: Secondary | ICD-10-CM

## 2018-02-22 DIAGNOSIS — Z3041 Encounter for surveillance of contraceptive pills: Secondary | ICD-10-CM

## 2018-02-22 DIAGNOSIS — Z1339 Encounter for screening examination for other mental health and behavioral disorders: Secondary | ICD-10-CM

## 2018-02-22 DIAGNOSIS — Z124 Encounter for screening for malignant neoplasm of cervix: Secondary | ICD-10-CM | POA: Diagnosis not present

## 2018-02-22 DIAGNOSIS — Z1331 Encounter for screening for depression: Secondary | ICD-10-CM | POA: Diagnosis not present

## 2018-02-22 LAB — HM PAP SMEAR: HM Pap smear: NORMAL

## 2018-02-22 NOTE — Addendum Note (Signed)
Addended by: Prentice Docker D on: 02/22/2018 03:51 PM   Modules accepted: Orders

## 2018-02-22 NOTE — Progress Notes (Addendum)
Gynecology Annual Exam  PCP: Adin Hector, MD  Chief Complaint  Patient presents with  . Gynecologic Exam    History of Present Illness:  [name] is a 32 y.o. [id number] who LMP was Patient's last menstrual period was 02/12/2018 (approximate)., presents today for her annual examination.  Her menses are regular every 28-30 days, lasting 5 day(s).  Dysmenorrhea mild, occurring first 1-2 days of flow. She occasionally has spotting the week before her menses.   She is single partner, contraception - OCP (estrogen/progesterone).  Last Pap: 01/2016  Results were: no abnormalities /neg HPV DNA not done due to age Hx of STDs: none  There is no FH of breast cancer. There is no FH of ovarian cancer. The patient does not do self-breast exams.  Tobacco use: The patient denies current or previous tobacco use. Alcohol use: none Exercise: not active  The patient wears seatbelts: yes.    The patient reports that domestic violence is absent.   Past Medical History:  Diagnosis Date  . No known health problems    Past Surgical History:  Procedure Laterality Date  . NO PAST SURGERIES     Medications   Medication Sig Start Date End Date Taking? Authorizing Provider  Norgestimate-Ethinyl Estradiol Triphasic (TRI-LO-MARZIA) 0.18/0.215/0.25 MG-25 MCG tab Take 1 tablet by mouth daily. 02/16/17 05/11/17  Will Bonnet, MD    Allergies  Allergen Reactions  . Augmentin [Amoxicillin-Pot Clavulanate] Diarrhea  . Ceclor [Cefaclor] Hives  . Penicillins    Gynecologic History: Patient's last menstrual period was 02/12/2018 (approximate).  Obstetric History: [id number]  Social History   Socioeconomic History  . Marital status: Married    Spouse name: Not on file  . Number of children: Not on file  . Years of education: Not on file  . Highest education level: Not on file  Occupational History  . Not on file  Social Needs  . Financial resource strain: Not on file  . Food  insecurity:    Worry: Not on file    Inability: Not on file  . Transportation needs:    Medical: Not on file    Non-medical: Not on file  Tobacco Use  . Smoking status: Never Smoker  . Smokeless tobacco: Never Used  Substance and Sexual Activity  . Alcohol use: No  . Drug use: No  . Sexual activity: Yes    Birth control/protection: Pill  Lifestyle  . Physical activity:    Days per week: 5 days    Minutes per session: Not on file  . Stress: Not on file  Relationships  . Social connections:    Talks on phone: Not on file    Gets together: Not on file    Attends religious service: Not on file    Active member of club or organization: Not on file    Attends meetings of clubs or organizations: Not on file    Relationship status: Not on file  . Intimate partner violence:    Fear of current or ex partner: Not on file    Emotionally abused: Not on file    Physically abused: Not on file    Forced sexual activity: Not on file  Other Topics Concern  . Not on file  Social History Narrative  . Not on file    Family History  Problem Relation Age of Onset  . Stroke Father   . Non-Hodgkin's lymphoma Maternal Aunt   . Leukemia Maternal Uncle   . Colon  cancer Maternal Grandmother   . Brain cancer Maternal Grandfather   . Bladder Cancer Paternal Grandmother     Review of Systems  Constitutional: Negative.   HENT: Negative.   Eyes: Negative.   Respiratory: Negative.   Cardiovascular: Negative.   Gastrointestinal: Negative.   Genitourinary: Negative.   Musculoskeletal: Negative.   Skin: Negative.   Neurological: Negative.   Psychiatric/Behavioral: Negative.      Physical Exam BP 116/68 (BP Location: Left Arm, Patient Position: Sitting, Cuff Size: Normal)   Pulse (!) 106   Ht 5\' 7"  (1.702 m)   Wt 183 lb (83 kg)   LMP 02/12/2018 (Approximate)   SpO2 99%   BMI 28.66 kg/m    Physical Exam  Constitutional: She is oriented to person, place, and time. She appears  well-developed and well-nourished. No distress.  Genitourinary: Uterus normal. Pelvic exam was performed with patient supine. There is no rash, tenderness, lesion or injury on the right labia. There is no rash, tenderness, lesion or injury on the left labia. No erythema, tenderness or bleeding in the vagina. No signs of injury around the vagina. No vaginal discharge found. Right adnexum does not display mass, does not display tenderness and does not display fullness. Left adnexum does not display mass, does not display tenderness and does not display fullness. Cervix does not exhibit motion tenderness, lesion, discharge or polyp.   Uterus is mobile and anteverted. Uterus is not enlarged, tender or exhibiting a mass.  HENT:  Head: Normocephalic and atraumatic.  Eyes: EOM are normal. No scleral icterus.  Neck: Normal range of motion. Neck supple. No thyromegaly present.  Cardiovascular: Normal rate and regular rhythm. Exam reveals no gallop and no friction rub.  No murmur heard. Pulmonary/Chest: Effort normal and breath sounds normal. No respiratory distress. She has no wheezes. She has no rales. Right breast exhibits no inverted nipple, no mass, no nipple discharge, no skin change and no tenderness. Left breast exhibits no inverted nipple, no mass, no nipple discharge, no skin change and no tenderness.  Abdominal: Soft. Bowel sounds are normal. She exhibits no distension and no mass. There is no tenderness. There is no rebound and no guarding.  Musculoskeletal: Normal range of motion. She exhibits no edema or tenderness.  Lymphadenopathy:    She has no cervical adenopathy.       Right: No inguinal adenopathy present.       Left: No inguinal adenopathy present.  Neurological: She is alert and oriented to person, place, and time. No cranial nerve deficit.  Skin: Skin is warm and dry. No rash noted. No erythema.  Psychiatric: She has a normal mood and affect. Her behavior is normal. Judgment normal.    Female chaperone present for pelvic and breast  portions of the physical exam  Results: AUDIT Questionnaire (screen for alcoholism): 0 PHQ-9: 0   Assessment: 32 y.o. [id number] female here for routine annual gynecologic examination.  Plan: Problem List Items Addressed This Visit    None    Visit Diagnoses    Women's annual routine gynecological examination    -  Primary   Relevant Orders   Cytology - PAP   Screening for depression       Screening for alcoholism       Pap smear for cervical cancer screening       Relevant Orders   Cytology - PAP   Encounter for surveillance of contraceptive pills          Screening: -- Blood pressure  screen normal -- Colonoscopy - not due -- Mammogram - not due -- Weight screening: overweight: continue to monitor -- Depression screening negative (PHQ-9) -- Nutrition: normal -- cholesterol screening: not due for screening -- osteoporosis screening: not due -- tobacco screening: not using -- alcohol screening: AUDIT questionnaire indicates low-risk usage. -- family history of breast cancer screening: done. not at high risk. -- no evidence of domestic violence or intimate partner violence. -- STD screening: gonorrhea/chlamydia NAAT not collected per patient request. -- pap smear collected per ASCCP guidelines -- HPV vaccination series: has not received.  Declines at this time.    Contraception: rx provided last week for a 1 year supply.   Prentice Docker, MD 02/22/2018 3:51 PM

## 2018-02-24 LAB — CYTOLOGY - PAP
DIAGNOSIS: NEGATIVE
HPV: NOT DETECTED

## 2018-05-07 ENCOUNTER — Other Ambulatory Visit: Payer: Self-pay | Admitting: Obstetrics and Gynecology

## 2018-05-07 DIAGNOSIS — Z3041 Encounter for surveillance of contraceptive pills: Secondary | ICD-10-CM

## 2018-05-07 DIAGNOSIS — Z01419 Encounter for gynecological examination (general) (routine) without abnormal findings: Secondary | ICD-10-CM

## 2018-05-08 ENCOUNTER — Encounter: Payer: Self-pay | Admitting: Obstetrics and Gynecology

## 2019-02-09 IMAGING — CT CT ANGIO CHEST
2 of 6 series · 19 of 36 positions shown · IV contrast (APPLIED)
Comparison: Chest radiograph October 18, 2007

CLINICAL DATA: Tachycardia and shortness of breath at [REDACTED], seen for seasonal allergies. On birth control. Nonsmoker, no
recent travel.

EXAM:
CT ANGIOGRAPHY CHEST WITH CONTRAST
TECHNIQUE: Multidetector CT imaging of the chest was performed using the
standard protocol during bolus administration of intravenous
contrast. Multiplanar CT image reconstructions and MIPs were
obtained to evaluate the vascular anatomy.
CONTRAST:  75mL OMNIPAQUE IOHEXOL 350 MG/ML SOLN

[Series 5: thins · axial · 0.59mm/px · z∈[-225,+3]mm · 18 of 254 slices shown]
[im 13/254  lung]
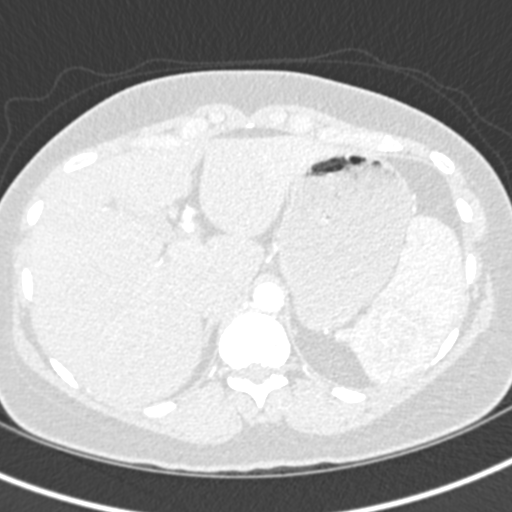
[im 26/254  mediastinal]
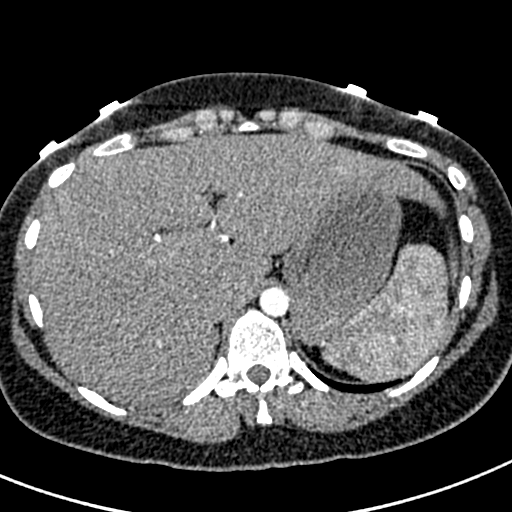
[im 38/254  lung]
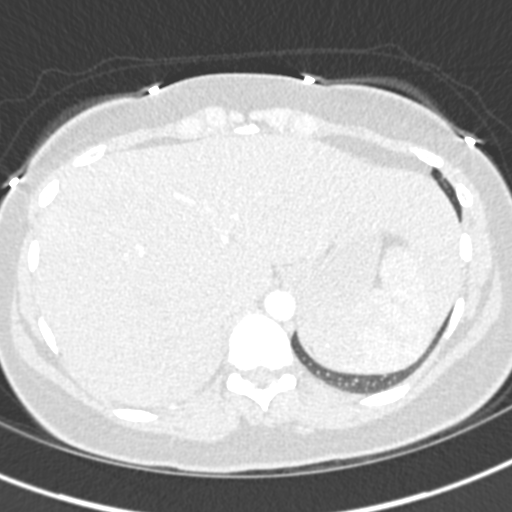
[im 51/254  mediastinal]
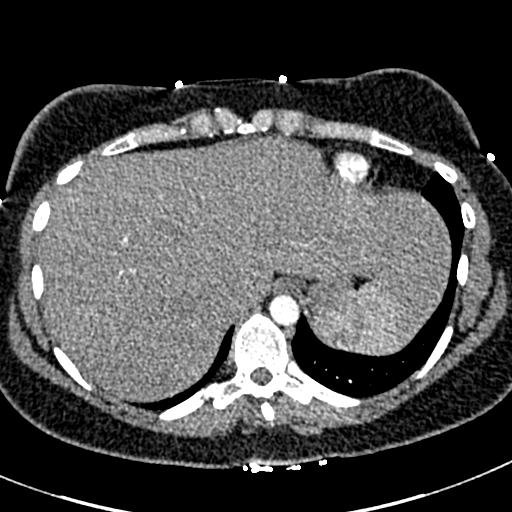
[im 64/254  lung]
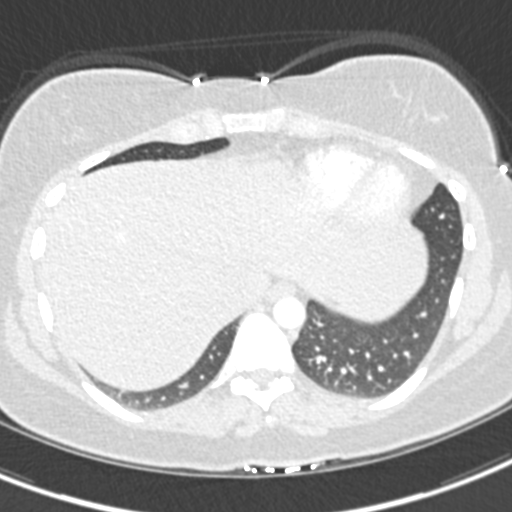
[im 76/254  mediastinal]
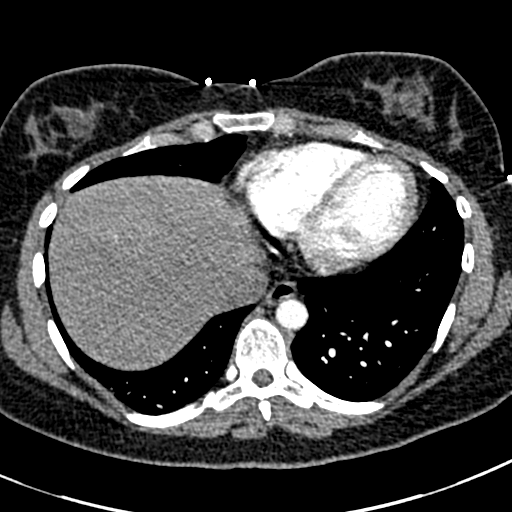
[im 89/254  lung]
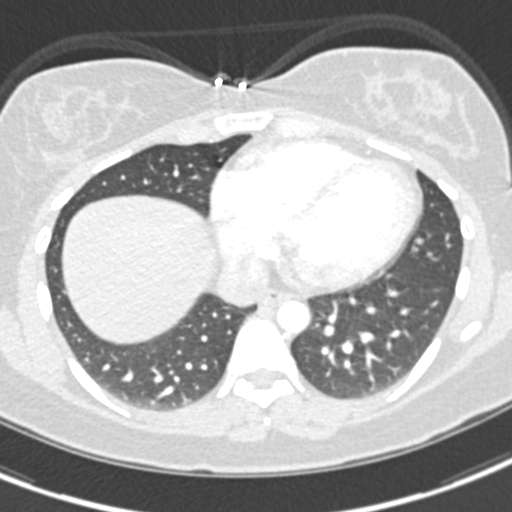
[im 102/254  mediastinal]
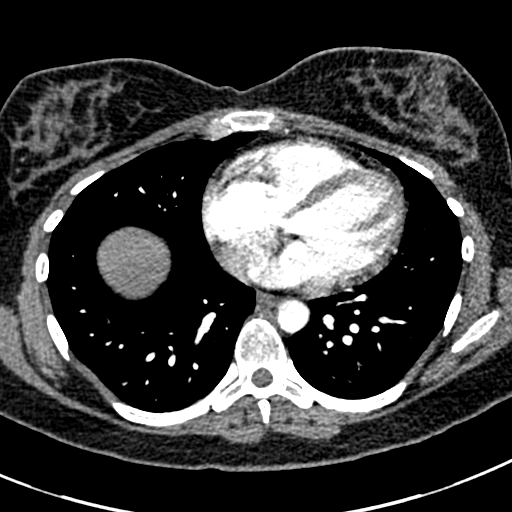
[im 114/254  lung]
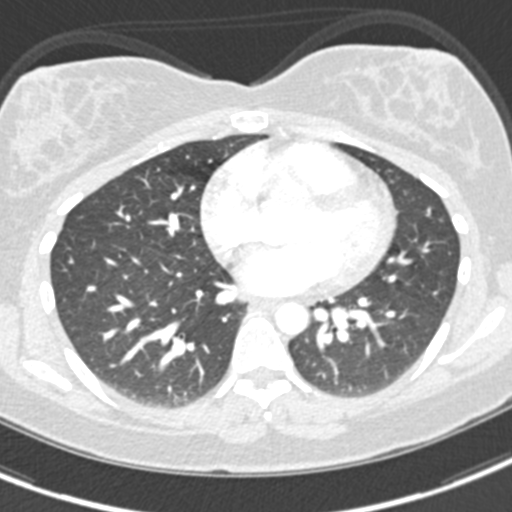
[im 140/254  mediastinal]
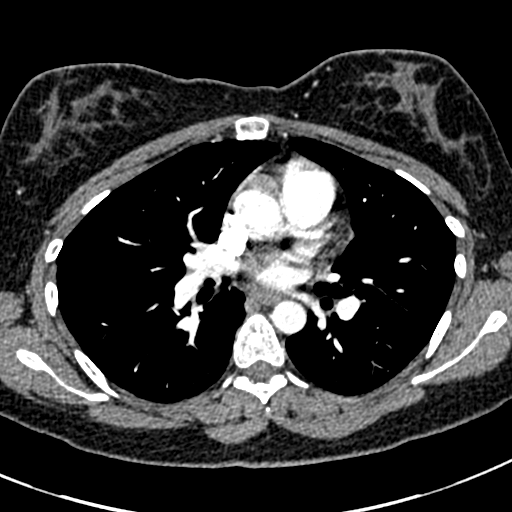
[im 152/254  lung]
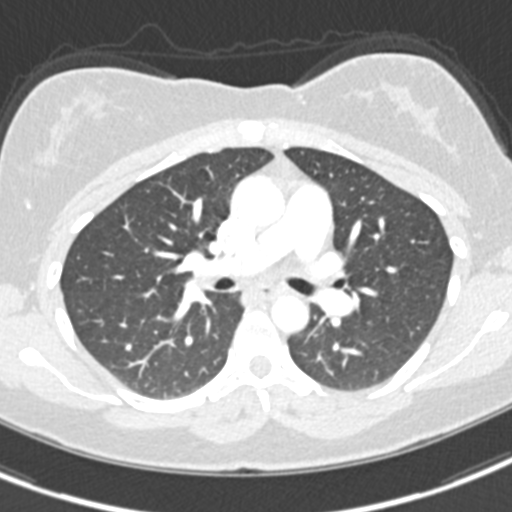
[im 165/254  mediastinal]
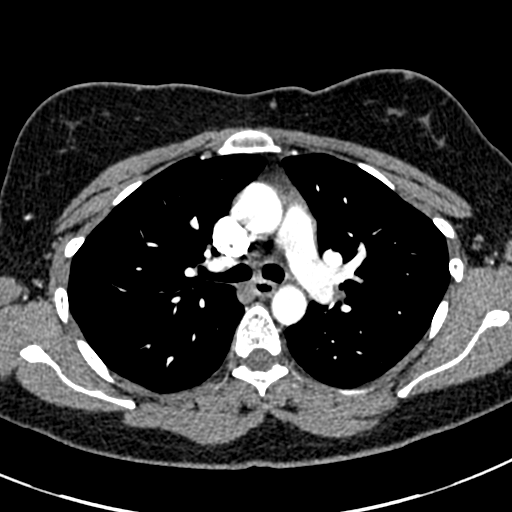
[im 178/254  lung]
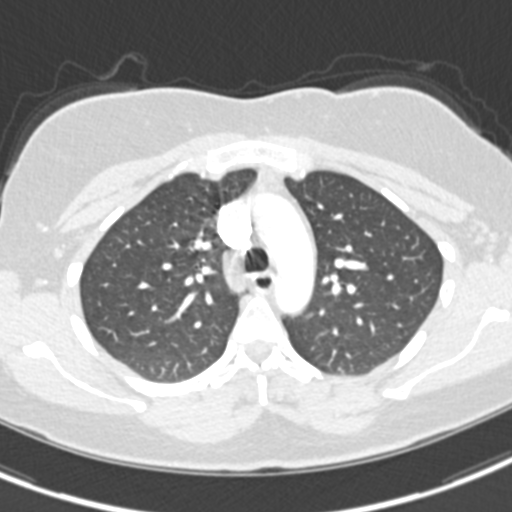
[im 190/254  mediastinal]
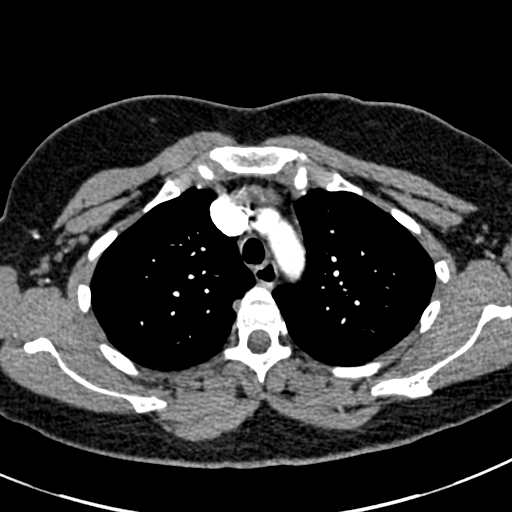
[im 203/254  lung]
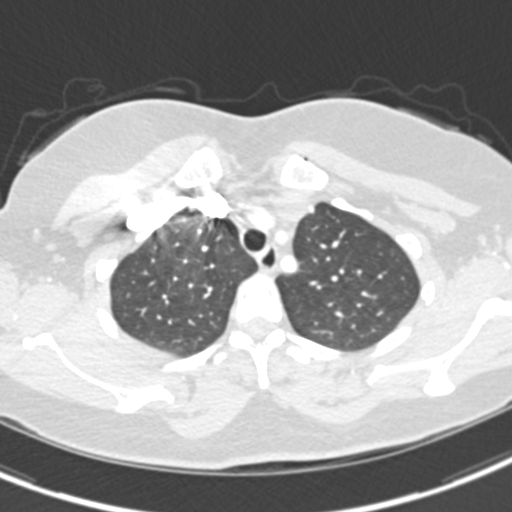
[im 216/254  mediastinal]
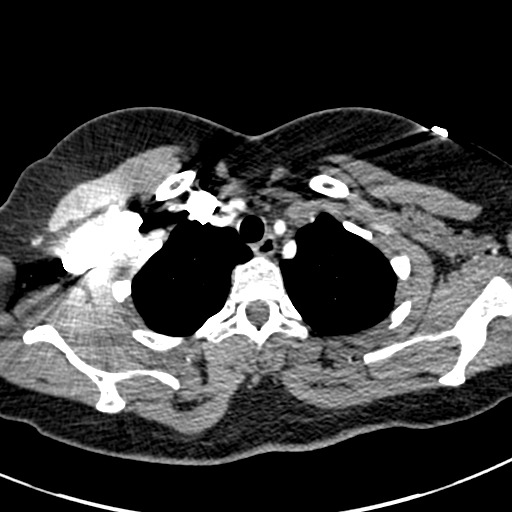
[im 228/254  lung]
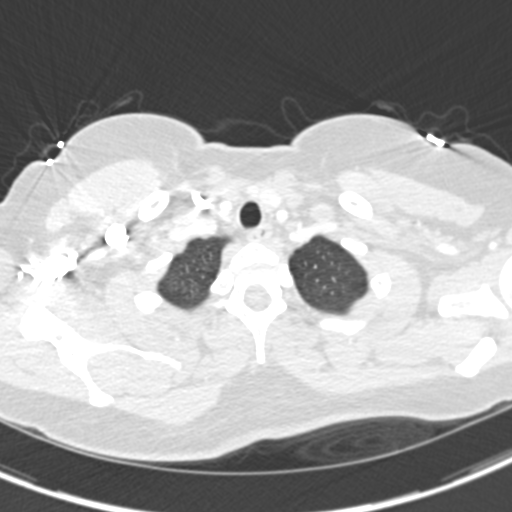
[im 241/254  mediastinal]
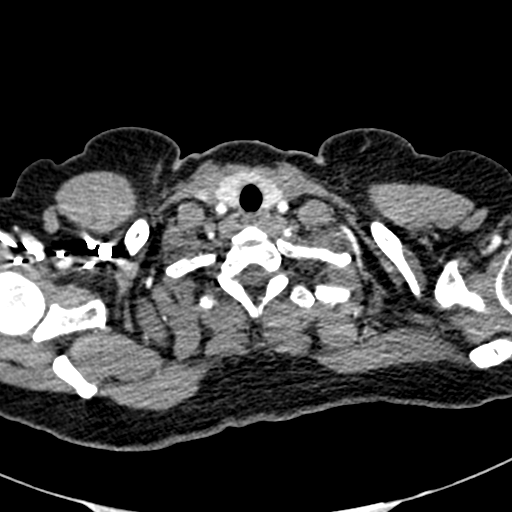

[Series 7: coronal mpr · coronal · 0.51mm/px · 1 of 83 slices shown]
[im 42/83  mediastinal]
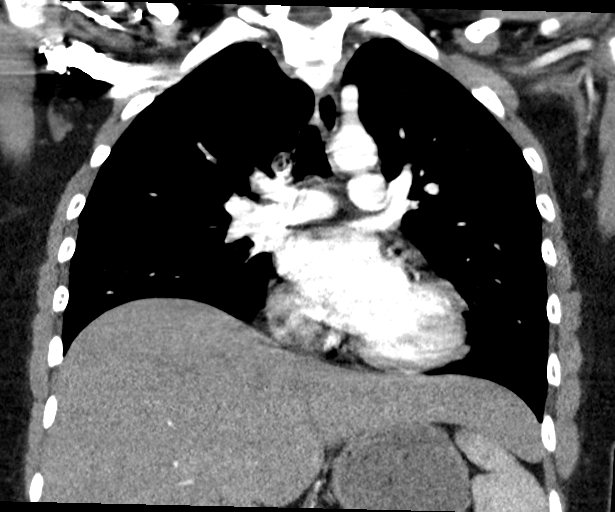

[19 of 36 positions shown; findings below may reference images not displayed]

FINDINGS: CARDIOVASCULAR: Adequate contrast opacification of the pulmonary
artery's. Main pulmonary artery is not enlarged. No pulmonary
arterial filling defects to the level of the subsegmental branches.
Heart size is normal, no right heart strain. No pericardial
effusion. Thoracic aorta is normal course and caliber, unremarkable.

MEDIASTINUM/NODES: No lymphadenopathy by CT size criteria.

LUNGS/PLEURA: Tracheobronchial tree is patent, no pneumothorax. No
pleural effusions, focal consolidations, pulmonary nodules or
masses.

UPPER ABDOMEN: Nonacute. Mildly hypodense liver, probable steatosis.

MUSCULOSKELETAL: Very mild degenerative change of the lower thoracic
spine.

Review of the MIP images confirms the above findings.
IMPRESSION: Normal CTA chest, no pulmonary embolism.

## 2019-03-03 ENCOUNTER — Encounter: Payer: Self-pay | Admitting: Obstetrics and Gynecology

## 2019-03-03 ENCOUNTER — Other Ambulatory Visit: Payer: Self-pay

## 2019-03-03 ENCOUNTER — Ambulatory Visit (INDEPENDENT_AMBULATORY_CARE_PROVIDER_SITE_OTHER): Payer: BC Managed Care – PPO | Admitting: Obstetrics and Gynecology

## 2019-03-03 VITALS — BP 122/76 | Ht 67.0 in | Wt 179.0 lb

## 2019-03-03 DIAGNOSIS — Z3041 Encounter for surveillance of contraceptive pills: Secondary | ICD-10-CM

## 2019-03-03 DIAGNOSIS — Z01419 Encounter for gynecological examination (general) (routine) without abnormal findings: Secondary | ICD-10-CM

## 2019-03-03 DIAGNOSIS — Z1339 Encounter for screening examination for other mental health and behavioral disorders: Secondary | ICD-10-CM

## 2019-03-03 DIAGNOSIS — Z1331 Encounter for screening for depression: Secondary | ICD-10-CM

## 2019-03-03 MED ORDER — NORGESTIM-ETH ESTRAD TRIPHASIC 0.18/0.215/0.25 MG-25 MCG PO TABS: 1.0000 | ORAL_TABLET | Freq: Every day | ORAL | 4 refills | Status: DC

## 2019-03-03 NOTE — Progress Notes (Signed)
Gynecology Annual Exam  PCP: Adin Hector, MD  Chief Complaint  Patient presents with  . Annual Exam    History of Present Illness:  [name] is a 33 y.o. [id number] who LMP was Patient's last menstrual period was 02/11/2019., presents today for her annual examination.  Her menses are regular every 28-30 days, lasting 5 day(s).  Dysmenorrhea mild, occurring first 1-2 days of flow. She occasionally has spotting the week before her menses.   She is single partner, contraception - OCP (estrogen/progesterone).  Last Pap: 02/2018  Results were: no abnormalities /neg HPV DNA negative Hx of STDs: none  There is no FH of breast cancer. There is no FH of ovarian cancer. The patient does not do self-breast exams.  Tobacco use: The patient denies current or previous tobacco use. Alcohol use: none Exercise: not active  The patient wears seatbelts: yes.    The patient reports that domestic violence is absent.   Past Medical History:  Diagnosis Date  . No known health problems    Past Surgical History:  Procedure Laterality Date  . NO PAST SURGERIES     Medications   Medication Sig Start Date End Date Taking? Authorizing Provider  Norgestimate-Ethinyl Estradiol Triphasic (TRI-LO-MARZIA) 0.18/0.215/0.25 MG-25 MCG tab Take 1 tablet by mouth daily. 02/16/17 05/11/17  Will Bonnet, MD    Allergies  Allergen Reactions  . Augmentin [Amoxicillin-Pot Clavulanate] Diarrhea  . Ceclor [Cefaclor] Hives  . Penicillins    Gynecologic History: Patient's last menstrual period was 02/11/2019.  Obstetric History: [id number]  Social History   Socioeconomic History  . Marital status: Married    Spouse name: Not on file  . Number of children: Not on file  . Years of education: Not on file  . Highest education level: Not on file  Occupational History  . Not on file  Social Needs  . Financial resource strain: Not on file  . Food insecurity    Worry: Not on file    Inability:  Not on file  . Transportation needs    Medical: Not on file    Non-medical: Not on file  Tobacco Use  . Smoking status: Never Smoker  . Smokeless tobacco: Never Used  Substance and Sexual Activity  . Alcohol use: No  . Drug use: No  . Sexual activity: Yes    Birth control/protection: Pill  Lifestyle  . Physical activity    Days per week: 5 days    Minutes per session: Not on file  . Stress: Not on file  Relationships  . Social Herbalist on phone: Not on file    Gets together: Not on file    Attends religious service: Not on file    Active member of club or organization: Not on file    Attends meetings of clubs or organizations: Not on file    Relationship status: Not on file  . Intimate partner violence    Fear of current or ex partner: Not on file    Emotionally abused: Not on file    Physically abused: Not on file    Forced sexual activity: Not on file  Other Topics Concern  . Not on file  Social History Narrative  . Not on file    Family History  Problem Relation Age of Onset  . Stroke Father   . Non-Hodgkin's lymphoma Maternal Aunt   . Leukemia Maternal Uncle   . Colon cancer Maternal Grandmother   .  Brain cancer Maternal Grandfather   . Bladder Cancer Paternal Grandmother     Review of Systems  Constitutional: Negative.   HENT: Negative.   Eyes: Negative.   Respiratory: Negative.   Cardiovascular: Negative.   Gastrointestinal: Negative.   Genitourinary: Negative.   Musculoskeletal: Negative.   Skin: Negative.   Neurological: Negative.   Psychiatric/Behavioral: Negative.      Physical Exam BP 122/76   Ht 5\' 7"  (1.702 m)   Wt 179 lb (81.2 kg)   LMP 02/11/2019   BMI 28.04 kg/m    Physical Exam Constitutional:      General: She is not in acute distress.    Appearance: She is well-developed.  Genitourinary:     Pelvic exam was performed with patient supine.     Uterus normal.     No signs of injury in the vagina.     No vaginal  discharge, erythema, tenderness or bleeding.     No cervical motion tenderness, discharge, lesion or polyp.     Uterus is mobile.     Uterus is not enlarged or tender.     No uterine mass detected.    Uterus is anteverted.     No right or left adnexal mass present.     Right adnexa not tender or full.     Left adnexa not tender or full.  HENT:     Head: Normocephalic and atraumatic.  Eyes:     General: No scleral icterus. Neck:     Musculoskeletal: Normal range of motion and neck supple.     Thyroid: No thyromegaly.  Cardiovascular:     Rate and Rhythm: Normal rate and regular rhythm.     Heart sounds: No murmur. No friction rub. No gallop.   Pulmonary:     Effort: Pulmonary effort is normal. No respiratory distress.     Breath sounds: Normal breath sounds. No wheezing or rales.  Chest:     Breasts:        Right: No inverted nipple, mass, nipple discharge, skin change or tenderness.        Left: No inverted nipple, mass, nipple discharge, skin change or tenderness.  Abdominal:     General: Bowel sounds are normal. There is no distension.     Palpations: Abdomen is soft. There is no mass.     Tenderness: There is no abdominal tenderness. There is no guarding or rebound.  Musculoskeletal: Normal range of motion.        General: No tenderness.  Lymphadenopathy:     Cervical: No cervical adenopathy.     Lower Body: No right inguinal adenopathy. No left inguinal adenopathy.  Neurological:     Mental Status: She is alert and oriented to person, place, and time.     Cranial Nerves: No cranial nerve deficit.  Skin:    General: Skin is warm and dry.     Findings: No erythema or rash.  Psychiatric:        Behavior: Behavior normal.        Judgment: Judgment normal.    Female chaperone present for pelvic and breast  portions of the physical exam  Results: AUDIT Questionnaire (screen for alcoholism): 0 PHQ-9: 1 GAD-7: 7  Assessment: 33 y.o. [id number] female here for routine  annual gynecologic examination.  Plan: Problem List Items Addressed This Visit    None    Visit Diagnoses    Women's annual routine gynecological examination    -  Primary   Relevant  Medications   Norgestimate-Ethinyl Estradiol Triphasic (TRI-LO-MARZIA) 0.18/0.215/0.25 MG-25 MCG tab   Screening for depression       Screening for alcoholism       Encounter for surveillance of contraceptive pills       Relevant Medications   Norgestimate-Ethinyl Estradiol Triphasic (TRI-LO-MARZIA) 0.18/0.215/0.25 MG-25 MCG tab      Screening: -- Blood pressure screen normal -- Colonoscopy - not due -- Mammogram - not due -- Weight screening: overweight: continue to monitor -- Depression screening negative (PHQ-9) -- Nutrition: normal -- cholesterol screening: not due for screening -- osteoporosis screening: not due -- tobacco screening: not using -- alcohol screening: AUDIT questionnaire indicates low-risk usage. -- family history of breast cancer screening: done. not at high risk. -- no evidence of domestic violence or intimate partner violence. -- STD screening: gonorrhea/chlamydia NAAT not collected per patient request. -- pap smear not collected per ASCCP guidelines -- HPV vaccination series: has not received.  Declines at this time.    Contraception: rx provided last week for a 1 year supply.   Prentice Docker, MD 03/03/2019 8:46 AM

## 2019-08-14 ENCOUNTER — Ambulatory Visit: Payer: BC Managed Care – PPO | Attending: Internal Medicine

## 2019-08-14 DIAGNOSIS — Z20822 Contact with and (suspected) exposure to covid-19: Secondary | ICD-10-CM

## 2019-08-15 LAB — NOVEL CORONAVIRUS, NAA: SARS-CoV-2, NAA: NOT DETECTED

## 2020-03-08 ENCOUNTER — Ambulatory Visit (INDEPENDENT_AMBULATORY_CARE_PROVIDER_SITE_OTHER): Payer: BC Managed Care – PPO | Admitting: Obstetrics and Gynecology

## 2020-03-08 ENCOUNTER — Encounter: Payer: Self-pay | Admitting: Obstetrics and Gynecology

## 2020-03-08 ENCOUNTER — Other Ambulatory Visit: Payer: Self-pay

## 2020-03-08 VITALS — BP 100/60 | Ht 67.0 in | Wt 176.0 lb

## 2020-03-08 DIAGNOSIS — Z3041 Encounter for surveillance of contraceptive pills: Secondary | ICD-10-CM

## 2020-03-08 DIAGNOSIS — Z1331 Encounter for screening for depression: Secondary | ICD-10-CM

## 2020-03-08 DIAGNOSIS — Z01419 Encounter for gynecological examination (general) (routine) without abnormal findings: Secondary | ICD-10-CM | POA: Diagnosis not present

## 2020-03-08 DIAGNOSIS — Z1339 Encounter for screening examination for other mental health and behavioral disorders: Secondary | ICD-10-CM | POA: Diagnosis not present

## 2020-03-08 MED ORDER — NORGESTIM-ETH ESTRAD TRIPHASIC 0.18/0.215/0.25 MG-25 MCG PO TABS: 1.0000 | ORAL_TABLET | Freq: Every day | ORAL | 4 refills | Status: DC

## 2020-03-08 NOTE — Progress Notes (Signed)
Gynecology Annual Exam  PCP: Adin Hector, MD   Chief Complaint  Patient presents with  . Gynecologic Exam    tenderness/pressure on left breast during intercourse   History of Present Illness:  [name] is a 34 y.o. [id number] who LMP was Patient's last menstrual period was 02/09/2020 (approximate)., presents today for her annual examination.  Her menses are regular every 28-30 days, lasting 5 day(s).  Dysmenorrhea moderate, occurring first 1-2 days of flow. She does not have intermenstrual bleeding.  A couple of months ago she had pain in placing a tampon. Since then she has pressure about half the time that she has intercourse.  She noted no abnormal discharge, no abnormal odor, no itching, burning, irritation.   She is sexually active. She uses combined OCPs.  Last Pap: 02/2018  Results were: no abnormalities /neg HPV DNA negative Hx of STDs: none  There is no FH of breast cancer. There is no FH of ovarian cancer. The patient does do self-breast exams.  Tobacco use: The patient denies current or previous tobacco use. Alcohol use: none Exercise: sometimes  The patient wears seatbelts: yes.   The patient reports that domestic violence in her life is absent.   Several months ago she noted the onset of left breast pain, noted on the left side or on the right side/middle.  The pain is not at the skin level, but not deep either.  The pain is sharp, described as a thin line of pain.  The pain can last for several minutes. She can't think of what might bring it on.  Aggravating factors: none. Alleviating factors: none.  Associated symptoms: none.  She has a history of mastitis in that breast x 2.  She had symptoms of COVID just before this, though she tested negative (her husband tested positive around the same time).    Past Medical History:  Diagnosis Date  . No known health problems     Past Surgical History:  Procedure Laterality Date  . NO PAST SURGERIES       Prior to Admission medications   Medication Sig Start Date End Date Taking? Authorizing Provider  azelastine (ASTELIN) 0.1 % nasal spray Place into the nose.   Yes [provider]  Norgestimate-Ethinyl Estradiol Triphasic (TRI-LO-MARZIA) 0.18/0.215/0.25 MG-25 MCG tab Take 1 tablet by mouth daily. 03/03/19  Yes Will Bonnet, MD    Allergies  Allergen Reactions  . Augmentin [Amoxicillin-Pot Clavulanate] Diarrhea  . Ceclor [Cefaclor] Hives  . Penicillins Other (See Comments)   Obstetric History: [id number]  Social History   Socioeconomic History  . Marital status: Married    Spouse name: Not on file  . Number of children: Not on file  . Years of education: Not on file  . Highest education level: Not on file  Occupational History  . Not on file  Tobacco Use  . Smoking status: Never Smoker  . Smokeless tobacco: Never Used  Vaping Use  . Vaping Use: Never used  Substance and Sexual Activity  . Alcohol use: No  . Drug use: No  . Sexual activity: Yes    Birth control/protection: Pill  Other Topics Concern  . Not on file  Social History Narrative  . Not on file   Social Determinants of Health   Financial Resource Strain:   . Difficulty of Paying Living Expenses: Not on file  Food Insecurity:   . Worried About Charity fundraiser in the Last Year: Not on  file  . [place] in the Last Year: Not on file  Transportation Needs:   . Lack of Transportation (Medical): Not on file  . Lack of Transportation (Non-Medical): Not on file  Physical Activity:   . Days of Exercise per Week: Not on file  . Minutes of Exercise per Session: Not on file  Stress:   . Feeling of Stress : Not on file  Social Connections:   . Frequency of Communication with Friends and Family: Not on file  . Frequency of Social Gatherings with Friends and Family: Not on file  . Attends Religious Services: Not on file  . Active Member of Clubs or Organizations: Not on file  . Attends  Archivist Meetings: Not on file  . Marital Status: Not on file  Intimate Partner Violence:   . Fear of Current or Ex-Partner: Not on file  . Emotionally Abused: Not on file  . Physically Abused: Not on file  . Sexually Abused: Not on file    Family History  Problem Relation Age of Onset  . Stroke Father   . Non-Hodgkin's lymphoma Maternal Aunt   . Leukemia Maternal Uncle   . Colon cancer Maternal Grandmother   . Brain cancer Maternal Grandfather   . Bladder Cancer Paternal Grandmother     Review of Systems  Constitutional: Negative.   HENT: Negative.   Eyes: Negative.   Respiratory: Negative.   Cardiovascular: Negative.   Gastrointestinal: Negative.   Genitourinary: Negative.   Musculoskeletal: Negative.   Skin: Negative.   Neurological: Negative.   Psychiatric/Behavioral: Negative.      Physical Exam BP 100/60   Ht 5\' 7"  (1.702 m)   Wt 176 lb (79.8 kg)   LMP 02/09/2020 (Approximate)   Breastfeeding No   BMI 27.57 kg/m    Physical Exam Constitutional:      General: She is not in acute distress.    Appearance: Normal appearance. She is well-developed.  Genitourinary:     Pelvic exam was performed with patient in the lithotomy position.     Vulva, urethra, bladder and uterus normal.     No inguinal adenopathy present in the right or left side.    No signs of injury in the vagina.     No vaginal discharge, erythema, tenderness or bleeding.     No cervical motion tenderness, discharge, lesion or polyp.     Uterus is mobile.     Uterus is not enlarged or tender.     No uterine mass detected.    Uterus is anteverted.     No right or left adnexal mass present.     Right adnexa not tender or full.     Left adnexa not tender or full.  HENT:     Head: Normocephalic and atraumatic.  Eyes:     General: No scleral icterus.    Conjunctiva/sclera: Conjunctivae normal.  Neck:     Thyroid: No thyromegaly.  Cardiovascular:     Rate and Rhythm: Normal rate  and regular rhythm.     Heart sounds: No murmur heard.  No friction rub. No gallop.   Pulmonary:     Effort: Pulmonary effort is normal. No respiratory distress.     Breath sounds: Normal breath sounds. No wheezing or rales.  Chest:     Breasts:        Right: No inverted nipple, mass, nipple discharge, skin change or tenderness.        Left: No inverted  nipple, mass, nipple discharge, skin change or tenderness.  Abdominal:     General: Bowel sounds are normal. There is no distension.     Palpations: Abdomen is soft. There is no mass.     Tenderness: There is no abdominal tenderness. There is no guarding or rebound.  Musculoskeletal:        General: No swelling or tenderness. Normal range of motion.     Cervical back: Normal range of motion and neck supple.  Lymphadenopathy:     Cervical: No cervical adenopathy.     Lower Body: No right inguinal adenopathy. No left inguinal adenopathy.  Neurological:     General: No focal deficit present.     Mental Status: She is alert and oriented to person, place, and time.     Cranial Nerves: No cranial nerve deficit.  Skin:    General: Skin is warm and dry.     Findings: No erythema or rash.  Psychiatric:        Mood and Affect: Mood normal.        Behavior: Behavior normal.        Judgment: Judgment normal.     Female chaperone present for pelvic and breast  portions of the physical exam  Results: AUDIT Questionnaire (screen for alcoholism): 0 PHQ-9: 1  Assessment: 34 y.o. [id number] female here for routine annual gynecologic examination  Plan: Problem List Items Addressed This Visit    None    Visit Diagnoses    Women's annual routine gynecological examination    -  Primary   Relevant Medications   Norgestimate-Ethinyl Estradiol Triphasic (TRI-LO-MARZIA) 0.18/0.215/0.25 MG-25 MCG tab   Screening for depression       Screening for alcoholism       Encounter for surveillance of contraceptive pills       Relevant Medications    Norgestimate-Ethinyl Estradiol Triphasic (TRI-LO-MARZIA) 0.18/0.215/0.25 MG-25 MCG tab      Screening: -- Blood pressure screen normal -- Weight screening: normal -- Depression screening negative (PHQ-9) -- Nutrition: normal -- cholesterol screening: not due for screening -- osteoporosis screening: not due -- tobacco screening: not using -- alcohol screening: AUDIT questionnaire indicates low-risk usage. -- family history of breast cancer screening: done. not at high risk. -- no evidence of domestic violence or intimate partner violence. -- STD screening: gonorrhea/chlamydia NAAT not collected per patient request. -- pap smear not collected per ASCCP guidelines  Mastalgia: continue to monitor.  If sx don't improve or get worse, will get breast imaging.   Prentice Docker, MD 03/08/2020 4:30 PM

## 2021-01-02 ENCOUNTER — Other Ambulatory Visit: Payer: Self-pay

## 2021-01-02 ENCOUNTER — Ambulatory Visit: Payer: BC Managed Care – PPO | Admitting: Dermatology

## 2021-01-02 DIAGNOSIS — Z1283 Encounter for screening for malignant neoplasm of skin: Secondary | ICD-10-CM

## 2021-01-02 DIAGNOSIS — L814 Other melanin hyperpigmentation: Secondary | ICD-10-CM | POA: Diagnosis not present

## 2021-01-02 DIAGNOSIS — L821 Other seborrheic keratosis: Secondary | ICD-10-CM | POA: Diagnosis not present

## 2021-01-02 DIAGNOSIS — D225 Melanocytic nevi of trunk: Secondary | ICD-10-CM

## 2021-01-02 DIAGNOSIS — L578 Other skin changes due to chronic exposure to nonionizing radiation: Secondary | ICD-10-CM

## 2021-01-02 DIAGNOSIS — D1800 Hemangioma unspecified site: Secondary | ICD-10-CM

## 2021-01-02 DIAGNOSIS — D229 Melanocytic nevi, unspecified: Secondary | ICD-10-CM

## 2021-01-02 DIAGNOSIS — D492 Neoplasm of unspecified behavior of bone, soft tissue, and skin: Secondary | ICD-10-CM

## 2021-01-02 DIAGNOSIS — Z86018 Personal history of other benign neoplasm: Secondary | ICD-10-CM | POA: Diagnosis not present

## 2021-01-02 NOTE — Progress Notes (Deleted)
   Follow-Up Visit   Subjective  Kathleen Marsh is a 35 y.o. female who presents for the following: Annual Exam (Mole check, hx of Dysplastic nevus ).   The following portions of the chart were reviewed this encounter and updated as appropriate:        Review of Systems:  No other skin or systemic complaints except as noted in HPI or Assessment and Plan.  Objective  Well appearing patient in no apparent distress; mood and affect are within normal limits.  {[id number]::"P full examination was performed including scalp, head, eyes, ears, nose, lips, neck, chest, axillae, abdomen, back, buttocks, bilateral upper extremities, bilateral lower extremities, hands, feet, fingers, toes, fingernails, and toenails. All findings within normal limits unless otherwise noted below."}  left scapula 0.6 cm brown macule     Assessment & Plan  Neoplasm of skin left scapula   No follow-ups on file.

## 2021-01-02 NOTE — Progress Notes (Signed)
Follow-Up Visit   Subjective  Kathleen Marsh is a 35 y.o. female who presents for the following: Annual Exam (Mole check, hx of Dysplastic nevus ). The patient presents for Total-Body Skin Exam (TBSE) for skin cancer screening and mole check.   The following portions of the chart were reviewed this encounter and updated as appropriate:   Tobacco  Allergies  Meds  Problems  Med Hx  Surg Hx  Fam Hx      Review of Systems:  No other skin or systemic complaints except as noted in HPI or Assessment and Plan.  Objective  Well appearing patient in no apparent distress; mood and affect are within normal limits.  A full examination was performed including scalp, head, eyes, ears, nose, lips, neck, chest, axillae, abdomen, back, buttocks, bilateral upper extremities, bilateral lower extremities, hands, feet, fingers, toes, fingernails, and toenails. All findings within normal limits unless otherwise noted below.  left scapula 0.6 cm brown macule   right upper gastric lateral 0.7 cm brown macule   right upper gastric medial 0.6 cm brown macule    Assessment & Plan  Neoplasm of skin (3) left scapula  Epidermal / dermal shaving  Lesion diameter (cm):  0.6 Informed consent: discussed and consent obtained   Timeout: patient name, date of birth, surgical site, and procedure verified   Procedure prep:  Patient was prepped and draped in usual sterile fashion Prep type:  Isopropyl alcohol Anesthesia: the lesion was anesthetized in a standard fashion   Anesthetic:  1% lidocaine w/ epinephrine 1-100,000 buffered w/ 8.4% NaHCO3 Hemostasis achieved with: pressure, aluminum chloride and electrodesiccation   Outcome: patient tolerated procedure well   Post-procedure details: sterile dressing applied and wound care instructions given   Dressing type: bandage and petrolatum    Specimen 1 - Surgical pathology Differential Diagnosis: R/O Irritated nevus vs Dysplastic nevus   Check  Margins: No  right upper gastric lateral  Epidermal / dermal shaving  Lesion diameter (cm):  0.6 Informed consent: discussed and consent obtained   Timeout: patient name, date of birth, surgical site, and procedure verified   Procedure prep:  Patient was prepped and draped in usual sterile fashion Prep type:  Isopropyl alcohol Anesthesia: the lesion was anesthetized in a standard fashion   Anesthetic:  1% lidocaine w/ epinephrine 1-100,000 buffered w/ 8.4% NaHCO3 Hemostasis achieved with: pressure, aluminum chloride and electrodesiccation   Outcome: patient tolerated procedure well   Post-procedure details: sterile dressing applied and wound care instructions given   Dressing type: bandage and petrolatum    Specimen 2 - Surgical pathology Differential Diagnosis: R/O Irritated nevus vs Dysplastic nevus   Check Margins: No  right upper gastric medial  Epidermal / dermal shaving  Lesion diameter (cm):  0.7 Informed consent: discussed and consent obtained   Timeout: patient name, date of birth, surgical site, and procedure verified   Procedure prep:  Patient was prepped and draped in usual sterile fashion Prep type:  Isopropyl alcohol Anesthesia: the lesion was anesthetized in a standard fashion   Anesthetic:  1% lidocaine w/ epinephrine 1-100,000 buffered w/ 8.4% NaHCO3 Hemostasis achieved with: pressure, aluminum chloride and electrodesiccation   Outcome: patient tolerated procedure well   Post-procedure details: sterile dressing applied and wound care instructions given   Dressing type: bandage and petrolatum    Specimen 3 - Surgical pathology Differential Diagnosis: R/O Irritated nevus vs Dysplastic nevus   Check Margins: No  Skin cancer screening  Lentigines - Scattered tan macules -  Due to sun exposure - Benign-appering, observe - Recommend daily broad spectrum sunscreen SPF 30+ to sun-exposed areas, reapply every 2 hours as needed. - Call for any  changes  Seborrheic Keratoses - Stuck-on, waxy, tan-brown papules and/or plaques  - Benign-appearing - Discussed benign etiology and prognosis. - Observe - Call for any changes  Melanocytic Nevi - Tan-brown and/or pink-flesh-colored symmetric macules and papules - Benign appearing on exam today - Observation - Call clinic for new or changing moles - Recommend daily use of broad spectrum spf 30+ sunscreen to sun-exposed areas.   Hemangiomas - Red papules - Discussed benign nature - Observe - Call for any changes  Actinic Damage - Chronic condition, secondary to cumulative UV/sun exposure - diffuse scaly erythematous macules with underlying dyspigmentation - Recommend daily broad spectrum sunscreen SPF 30+ to sun-exposed areas, reapply every 2 hours as needed.  - Staying in the shade or wearing long sleeves, sun glasses (UVA+UVB protection) and wide brim hats (4-inch brim around the entire circumference of the hat) are also recommended for sun protection.  - Call for new or changing lesions.  History of Dysplastic Nevi Multiple see history  - No evidence of recurrence today - Recommend regular full body skin exams - Recommend daily broad spectrum sunscreen SPF 30+ to sun-exposed areas, reapply every 2 hours as needed.  - Call if any new or changing lesions are noted between office visits  Skin cancer screening performed today.  Return in about 1 year (around 01/02/2022) for TBSE, hx of Dysplastic.  IMarye Round, CMA, am acting as scribe for Sarina Ser, MD . Documentation: I have reviewed the above documentation for accuracy and completeness, and I agree with the above.  Sarina Ser, MD

## 2021-01-02 NOTE — Patient Instructions (Addendum)
Wound Care Instructions  Cleanse wound gently with soap and water once a day then pat dry with clean gauze. Apply a thing coat of Petrolatum (petroleum jelly, "Vaseline") over the wound (unless you have an allergy to this). We recommend that you use a new, sterile tube of Vaseline. Do not pick or remove scabs. Do not remove the yellow or white "healing tissue" from the base of the wound.  Cover the wound with fresh, clean, nonstick gauze and secure with paper tape. You may use Band-Aids in place of gauze and tape if the would is small enough, but would recommend trimming much of the tape off as there is often too much. Sometimes Band-Aids can irritate the skin.  You should call the office for your biopsy report after 1 week if you have not already been contacted.  If you experience any problems, such as abnormal amounts of bleeding, swelling, significant bruising, significant pain, or evidence of infection, please call the office immediately.  FOR ADULT SURGERY PATIENTS: If you need something for pain relief you may take 1 extra strength Tylenol (acetaminophen) AND 2 Ibuprofen (200mg each) together every 4 hours as needed for pain. (do not take these if you are allergic to them or if you have a reason you should not take them.) Typically, you may only need pain medication for 1 to 3 days.   If you have any questions or concerns for your doctor, please call our main line at [phone] and press option 4 to reach your doctor's medical assistant. If no one answers, please leave a voicemail as directed and we will return your call as soon as possible. Messages left after 4 pm will be answered the following business day.   You may also send us a message via MyChart. We typically respond to MyChart messages within 1-2 business days.  For prescription refills, please ask your pharmacy to contact our office. Our fax number is [phone].  If you have an urgent issue when the clinic is closed that  cannot wait until the next business day, you can page your doctor at the number below.    Please note that while we do our best to be available for urgent issues outside of office hours, we are not available 24/7.   If you have an urgent issue and are unable to reach us, you may choose to seek medical care at your doctor's office, retail clinic, urgent care center, or emergency room.  If you have a medical emergency, please immediately call 911 or go to the emergency department.  Pager Numbers  - Dr. Kowalski: [phone]  - Dr. Moye: [phone]  - Dr. Stewart: [phone]  In the event of inclement weather, please call our main line at [phone] for an update on the status of any delays or closures.  Dermatology Medication Tips: Please keep the boxes that topical medications come in in order to help keep track of the instructions about where and how to use these. Pharmacies typically print the medication instructions only on the boxes and not directly on the medication tubes.   If your medication is too expensive, please contact our office at [phone] option 4 or send us a message through MyChart.   We are unable to tell what your co-pay for medications will be in advance as this is different depending on your insurance coverage. However, we may be able to find a substitute medication at lower cost or fill out paperwork to get insurance to cover a needed   medication.   If a prior authorization is required to get your medication covered by your insurance company, please allow us 1-2 business days to complete this process.  Drug prices often vary depending on where the prescription is filled and some pharmacies may offer cheaper prices.  The website www.goodrx.com contains coupons for medications through different pharmacies. The prices here do not account for what the cost may be with help from insurance (it may be cheaper with your insurance), but the website can give you the  price if you did not use any insurance.  - You can print the associated coupon and take it with your prescription to the pharmacy.  - You may also stop by our office during regular business hours and pick up a GoodRx coupon card.  - If you need your prescription sent electronically to a different pharmacy, notify our office through [facility] MyChart or by phone at [phone] option 4.   

## 2021-01-16 ENCOUNTER — Telehealth: Payer: Self-pay

## 2021-01-16 ENCOUNTER — Encounter: Payer: Self-pay | Admitting: Dermatology

## 2021-01-16 NOTE — Telephone Encounter (Signed)
Advised patient of results/hd  

## 2021-01-16 NOTE — Telephone Encounter (Signed)
-----   Message from Brendolyn Patty, MD sent at 01/15/2021  9:05 PM EDT ----- 1. Skin , left scapula MELANOCYTIC NEVUS, INTRADERMAL TYPE, BASE INVOLVED 2. Skin , right upper gastric lateral MELANOCYTIC NEVUS, INTRADERMAL TYPE, BASE INVOLVED 3. Skin , right upper gastric medial MELANOCYTIC NEVUS, INTRADERMAL TYPE, BASE INVOLVED  Benign moles - please call pt

## 2021-03-13 ENCOUNTER — Encounter: Payer: Self-pay | Admitting: Obstetrics and Gynecology

## 2021-03-13 ENCOUNTER — Ambulatory Visit (INDEPENDENT_AMBULATORY_CARE_PROVIDER_SITE_OTHER): Payer: BC Managed Care – PPO | Admitting: Obstetrics and Gynecology

## 2021-03-13 ENCOUNTER — Other Ambulatory Visit: Payer: Self-pay

## 2021-03-13 VITALS — BP 120/70 | HR 95 | Ht 67.0 in | Wt 172.0 lb

## 2021-03-13 DIAGNOSIS — Z1331 Encounter for screening for depression: Secondary | ICD-10-CM | POA: Diagnosis not present

## 2021-03-13 DIAGNOSIS — Z01419 Encounter for gynecological examination (general) (routine) without abnormal findings: Secondary | ICD-10-CM

## 2021-03-13 DIAGNOSIS — Z1339 Encounter for screening examination for other mental health and behavioral disorders: Secondary | ICD-10-CM | POA: Diagnosis not present

## 2021-03-13 DIAGNOSIS — Z3041 Encounter for surveillance of contraceptive pills: Secondary | ICD-10-CM

## 2021-03-13 MED ORDER — NORGESTIM-ETH ESTRAD TRIPHASIC 0.18/0.215/0.25 MG-25 MCG PO TABS: 1.0000 | ORAL_TABLET | Freq: Every day | ORAL | 4 refills | Status: AC

## 2021-03-13 NOTE — Progress Notes (Signed)
Gynecology Annual Exam  PCP: Adin Hector, MD   Chief Complaint  Patient presents with   Annual Exam   History of Present Illness:  [name] is a 35 y.o. [id number] who LMP was Patient's last menstrual period was 03/08/2021., presents today for her annual examination.  Her menses are regular every 28-30 days, lasting 5 day(s).  Her last two periods have been heavier than normal.  The most recent one was more back normal.    Dysmenorrhea moderate, occurring first 1-2 days of flow. She does not have intermenstrual bleeding.  Sometimes during intercourse she gets pressure on her right side. She wonders whether she has a cyst.    She is sexually active. She uses combined OCPs.  Last Pap: 02/2018  Results were: no abnormalities /neg HPV DNA negative Hx of STDs: none  There is no FH of breast cancer. There is no FH of ovarian cancer. The patient does do self-breast exams.  Tobacco use: The patient denies current or previous tobacco use. Alcohol use: none Exercise: sometimes  The patient wears seatbelts: yes.   The patient reports that domestic violence in her life is absent.   Past Medical History:  Diagnosis Date   Dysplastic nevus 03/26/2009   L upper back 3.5 cm lat to spine - mild   Dysplastic nevus 03/26/2009   L mid back 5.0 cm lat to spine braline - moderate   Dysplastic nevus 02/21/2015   L paraspinal lower back - mild, residual 02/20/2016   No known health problems     Past Surgical History:  Procedure Laterality Date   NO PAST SURGERIES      Prior to Admission medications   Medication Sig Start Date End Date Taking? Authorizing Provider  azelastine (ASTELIN) 0.1 % nasal spray Place into the nose.   Yes [provider]  Norgestimate-Ethinyl Estradiol Triphasic (TRI-LO-MARZIA) 0.18/0.215/0.25 MG-25 MCG tab Take 1 tablet by mouth daily. 03/03/19  Yes Will Bonnet, MD    Allergies  Allergen Reactions   Augmentin [Amoxicillin-Pot  Clavulanate] Diarrhea   Ceclor [Cefaclor] Hives   Penicillins Other (See Comments)   Obstetric [id number]  Social History   Socioeconomic History   Marital status: Married    Spouse name: Not on file   Number of children: Not on file   Years of education: Not on file   Highest education level: Not on file  Occupational History   Not on file  Tobacco Use   Smoking status: Never   Smokeless tobacco: Never  Vaping Use   Vaping Use: Never used  Substance and Sexual Activity   Alcohol use: No   Drug use: No   Sexual activity: Yes    Birth control/protection: Pill  Other Topics Concern   Not on file  Social History Narrative   Not on file   Social Determinants of Health   Financial Resource Strain: Not on file  Food Insecurity: Not on file  Transportation Needs: Not on file  Physical Activity: Not on file  Stress: Not on file  Social Connections: Not on file  Intimate Partner Violence: Not on file    Family History  Problem Relation Age of Onset   Stroke Father    Non-Hodgkin's lymphoma Maternal Aunt    Leukemia Maternal Uncle    Colon cancer Maternal Grandmother    Brain cancer Maternal Grandfather    Bladder Cancer Paternal Grandmother     Review of Systems  Constitutional: Negative.  HENT: Negative.    Eyes: Negative.   Respiratory: Negative.    Cardiovascular: Negative.   Gastrointestinal: Negative.   Genitourinary: Negative.   Musculoskeletal: Negative.   Skin: Negative.   Neurological: Negative.   Psychiatric/Behavioral: Negative.      Physical Exam BP 120/70 (Cuff Size: Normal)   Pulse 95   Ht '5\' 7"'$  (1.702 m)   Wt 172 lb (78 kg)   LMP 03/08/2021   BMI 26.94 kg/m    Physical Exam Constitutional:      General: She is not in acute distress.    Appearance: Normal appearance. She is well-developed.  Genitourinary:     Vulva and bladder normal.     No vaginal discharge, erythema, tenderness or bleeding.      Right Adnexa: not tender,  not full and no mass present.    Left Adnexa: not tender, not full and no mass present.    No cervical motion tenderness, discharge, lesion or polyp.     Uterus is not enlarged or tender.     No uterine mass detected.    Pelvic exam was performed with patient in the lithotomy position.  Breasts:    Right: No inverted nipple, mass, nipple discharge, skin change or tenderness.     Left: No inverted nipple, mass, nipple discharge, skin change or tenderness.  HENT:     Head: Normocephalic and atraumatic.  Eyes:     General: No scleral icterus.    Conjunctiva/sclera: Conjunctivae normal.  Neck:     Thyroid: No thyromegaly.  Cardiovascular:     Rate and Rhythm: Normal rate and regular rhythm.     Heart sounds: No murmur heard.   No friction rub. No gallop.  Pulmonary:     Effort: Pulmonary effort is normal. No respiratory distress.     Breath sounds: Normal breath sounds. No wheezing or rales.  Abdominal:     General: Bowel sounds are normal. There is no distension.     Palpations: Abdomen is soft. There is no mass.     Tenderness: There is no abdominal tenderness. There is no guarding or rebound.  Musculoskeletal:        General: No swelling or tenderness. Normal range of motion.     Cervical back: Normal range of motion and neck supple.  Lymphadenopathy:     Cervical: No cervical adenopathy.     Lower Body: No right inguinal adenopathy. No left inguinal adenopathy.  Neurological:     General: No focal deficit present.     Mental Status: She is alert and oriented to person, place, and time.     Cranial Nerves: No cranial nerve deficit.  Skin:    General: Skin is warm and dry.     Findings: No erythema or rash.  Psychiatric:        Mood and Affect: Mood normal.        Behavior: Behavior normal.        Judgment: Judgment normal.    Female chaperone present for pelvic and breast  portions of the physical exam  Results: AUDIT Questionnaire (screen for alcoholism): 0 PHQ-9:  0  Assessment: 35 y.o. [id number] female here for routine annual gynecologic examination  Plan: Problem List Items Addressed This Visit   None Visit Diagnoses     Women's annual routine gynecological examination    -  Primary   Relevant Medications   Norgestimate-Ethinyl Estradiol Triphasic (TRI-LO-MARZIA) 0.18/0.215/0.25 MG-25 MCG tab   Screening for depression  Screening for alcoholism       Encounter for surveillance of contraceptive pills       Relevant Medications   Norgestimate-Ethinyl Estradiol Triphasic (TRI-LO-MARZIA) 0.18/0.215/0.25 MG-25 MCG tab      Screening: -- Blood pressure screen normal -- Weight screening: normal -- Depression screening negative (PHQ-9) -- Nutrition: normal -- cholesterol screening: not due for screening -- osteoporosis screening: not due -- tobacco screening: not using -- alcohol screening: AUDIT questionnaire indicates low-risk usage. -- family history of breast cancer screening: done. not at high risk. -- no evidence of domestic violence or intimate partner violence. -- STD screening: gonorrhea/chlamydia NAAT not collected per patient request. -- pap smear not collected per ASCCP guidelines  Refill birth control  Prentice Docker, MD 03/13/2021 4:16 PM

## 2021-07-28 ENCOUNTER — Other Ambulatory Visit: Payer: Self-pay | Admitting: Internal Medicine

## 2021-07-28 DIAGNOSIS — R2232 Localized swelling, mass and lump, left upper limb: Secondary | ICD-10-CM

## 2021-08-04 ENCOUNTER — Other Ambulatory Visit: Payer: Self-pay

## 2021-08-04 ENCOUNTER — Ambulatory Visit
Admission: RE | Admit: 2021-08-04 | Discharge: 2021-08-04 | Disposition: A | Discharge status: Home / Self Care | Payer: BC Managed Care – PPO | Source: Ambulatory Visit | Attending: Internal Medicine | Admitting: Internal Medicine

## 2021-08-04 DIAGNOSIS — R2232 Localized swelling, mass and lump, left upper limb: Secondary | ICD-10-CM | POA: Insufficient documentation

## 2022-01-07 ENCOUNTER — Encounter: Payer: BC Managed Care – PPO | Admitting: Dermatology

## 2022-01-26 ENCOUNTER — Encounter: Payer: BC Managed Care – PPO | Admitting: Dermatology

## 2022-04-22 ENCOUNTER — Ambulatory Visit: Payer: BC Managed Care – PPO | Admitting: Dermatology

## 2022-04-22 DIAGNOSIS — Z1283 Encounter for screening for malignant neoplasm of skin: Secondary | ICD-10-CM | POA: Diagnosis not present

## 2022-04-22 DIAGNOSIS — D229 Melanocytic nevi, unspecified: Secondary | ICD-10-CM

## 2022-04-22 DIAGNOSIS — L821 Other seborrheic keratosis: Secondary | ICD-10-CM | POA: Diagnosis not present

## 2022-04-22 DIAGNOSIS — Z86018 Personal history of other benign neoplasm: Secondary | ICD-10-CM | POA: Diagnosis not present

## 2022-04-22 DIAGNOSIS — L820 Inflamed seborrheic keratosis: Secondary | ICD-10-CM | POA: Diagnosis not present

## 2022-04-22 DIAGNOSIS — L738 Other specified follicular disorders: Secondary | ICD-10-CM | POA: Diagnosis not present

## 2022-04-22 DIAGNOSIS — L814 Other melanin hyperpigmentation: Secondary | ICD-10-CM

## 2022-04-22 DIAGNOSIS — D1801 Hemangioma of skin and subcutaneous tissue: Secondary | ICD-10-CM

## 2022-04-22 DIAGNOSIS — L578 Other skin changes due to chronic exposure to nonionizing radiation: Secondary | ICD-10-CM

## 2022-04-22 NOTE — Progress Notes (Unsigned)
Follow-Up Visit   Subjective  Kathleen Marsh is a 36 y.o. female who presents for the following: Annual Exam (History of dysplastic nevi - The patient presents for Total-Body Skin Exam (TBSE) for skin cancer screening and mole check.  The patient has spots, moles and lesions to be evaluated, some may be new or changing and the patient has concerns that these could be cancer./).  The following portions of the chart were reviewed this encounter and updated as appropriate:   Tobacco  Allergies  Meds  Problems  Med Hx  Surg Hx  Fam Hx     Review of Systems:  No other skin or systemic complaints except as noted in HPI or Assessment and Plan.  Objective  Well appearing patient in no apparent distress; mood and affect are within normal limits.  A full examination was performed including scalp, head, eyes, ears, nose, lips, neck, chest, axillae, abdomen, back, buttocks, bilateral upper extremities, bilateral lower extremities, hands, feet, fingers, toes, fingernails, and toenails. All findings within normal limits unless otherwise noted below.  Face Yellow papules  Neck (8) Erythematous stuck-on, waxy papule or plaque   Assessment & Plan  Sebaceous hyperplasia Face Benign-appearing.  Observation.  Call clinic for new or changing lesions.  Recommend daily use of broad spectrum spf 30+ sunscreen to sun-exposed areas.   Inflamed seborrheic keratosis (8) Neck Symptomatic, irritating, patient would like treated. Destruction of lesion - Neck Complexity: simple   Destruction method: cryotherapy   Informed consent: discussed and consent obtained   Timeout:  patient name, date of birth, surgical site, and procedure verified Lesion destroyed using liquid nitrogen: Yes   Region frozen until ice ball extended beyond lesion: Yes   Outcome: patient tolerated procedure well with no complications   Post-procedure details: wound care instructions given    Return in about 1 year  (around 04/23/2023) for TBSE.    Follow-Up Visit   Subjective  Kathleen Marsh is a 36 y.o. female who presents for the following: Annual Exam (History of dysplastic nevi - The patient presents for Total-Body Skin Exam (TBSE) for skin cancer screening and mole check.  The patient has spots, moles and lesions to be evaluated, some may be new or changing and the patient has concerns that these could be cancer./).  ***  The following portions of the chart were reviewed this encounter and updated as appropriate:   Tobacco  Allergies  Meds  Problems  Med Hx  Surg Hx  Fam Hx      Review of Systems:  No other skin or systemic complaints except as noted in HPI or Assessment and Plan.  Objective  Well appearing patient in no apparent distress; mood and affect are within normal limits.  A full examination was performed including scalp, head, eyes, ears, nose, lips, neck, chest, axillae, abdomen, back, buttocks, bilateral upper extremities, bilateral lower extremities, hands, feet, fingers, toes, fingernails, and toenails. All findings within normal limits unless otherwise noted below.  Face Yellow papules  Neck (8) Erythematous stuck-on, waxy papule or plaque    Assessment & Plan   History of Dysplastic Nevi - No evidence of recurrence today - Recommend regular full body skin exams - Recommend daily broad spectrum sunscreen SPF 30+ to sun-exposed areas, reapply every 2 hours as needed.  - Call if any new or changing lesions are noted between office visits   Lentigines - Scattered tan macules - Due to sun exposure - Benign-appearing, observe - Recommend daily broad  spectrum sunscreen SPF 30+ to sun-exposed areas, reapply every 2 hours as needed. - Call for any changes  Seborrheic Keratoses - Stuck-on, waxy, tan-brown papules and/or plaques  - Benign-appearing - Discussed benign etiology and prognosis. - Observe - Call for any changes  Melanocytic Nevi -  Tan-brown and/or pink-flesh-colored symmetric macules and papules - Benign appearing on exam today - Observation - Call clinic for new or changing moles - Recommend daily use of broad spectrum spf 30+ sunscreen to sun-exposed areas.   Hemangiomas - Red papules - Discussed benign nature - Observe - Call for any changes  Actinic Damage - Chronic condition, secondary to cumulative UV/sun exposure - diffuse scaly erythematous macules with underlying dyspigmentation - Recommend daily broad spectrum sunscreen SPF 30+ to sun-exposed areas, reapply every 2 hours as needed.  - Staying in the shade or wearing long sleeves, sun glasses (UVA+UVB protection) and wide brim hats (4-inch brim around the entire circumference of the hat) are also recommended for sun protection.  - Call for new or changing lesions.  Skin cancer screening performed today.  Sebaceous hyperplasia Face  Benign-appearing.  Observation.  Call clinic for new or changing lesions.  Recommend daily use of broad spectrum spf 30+ sunscreen to sun-exposed areas.    Inflamed seborrheic keratosis (8) Neck  Destruction of lesion - Neck Complexity: simple   Destruction method: cryotherapy   Informed consent: discussed and consent obtained   Timeout:  patient name, date of birth, surgical site, and procedure verified Lesion destroyed using liquid nitrogen: Yes   Region frozen until ice ball extended beyond lesion: Yes   Outcome: patient tolerated procedure well with no complications   Post-procedure details: wound care instructions given     Return in about 1 year (around 04/23/2023) for TBSE.  I, Ashok Cordia, CMA, am acting as scribe for Sarina Ser, MD .

## 2022-04-22 NOTE — Patient Instructions (Signed)
Cryotherapy Aftercare  Wash gently with soap and water everyday.   Apply Vaseline and Band-Aid daily until healed.     Due to recent changes in healthcare laws, you may see results of your pathology and/or laboratory studies on MyChart before the doctors have had a chance to review them. We understand that in some cases there may be results that are confusing or concerning to you. Please understand that not all results are received at the same time and often the doctors may need to interpret multiple results in order to provide you with the best plan of care or course of treatment. Therefore, we ask that you please give us 2 business days to thoroughly review all your results before contacting the office for clarification. Should we see a critical lab result, you will be contacted sooner.   If You Need Anything After Your Visit  If you have any questions or concerns for your doctor, please call our main line at [phone] and press option 4 to reach your doctor's medical assistant. If no one answers, please leave a voicemail as directed and we will return your call as soon as possible. Messages left after 4 pm will be answered the following business day.   You may also send us a message via MyChart. We typically respond to MyChart messages within 1-2 business days.  For prescription refills, please ask your pharmacy to contact our office. Our fax number is [phone].  If you have an urgent issue when the clinic is closed that cannot wait until the next business day, you can page your doctor at the number below.    Please note that while we do our best to be available for urgent issues outside of office hours, we are not available 24/7.   If you have an urgent issue and are unable to reach us, you may choose to seek medical care at your doctor's office, retail clinic, urgent care center, or emergency room.  If you have a medical emergency, please immediately call 911 or go to the  emergency department.  Pager Numbers  - Dr. Kowalski: [phone]  - Dr. Moye: [phone]  - Dr. Stewart: [phone]  In the event of inclement weather, please call our main line at [phone] for an update on the status of any delays or closures.  Dermatology Medication Tips: Please keep the boxes that topical medications come in in order to help keep track of the instructions about where and how to use these. Pharmacies typically print the medication instructions only on the boxes and not directly on the medication tubes.   If your medication is too expensive, please contact our office at [phone] option 4 or send us a message through MyChart.   We are unable to tell what your co-pay for medications will be in advance as this is different depending on your insurance coverage. However, we may be able to find a substitute medication at lower cost or fill out paperwork to get insurance to cover a needed medication.   If a prior authorization is required to get your medication covered by your insurance company, please allow us 1-2 business days to complete this process.  Drug prices often vary depending on where the prescription is filled and some pharmacies may offer cheaper prices.  The website www.goodrx.com contains coupons for medications through different pharmacies. The prices here do not account for what the cost may be with help from insurance (it may be cheaper with your insurance), but the website can   give you the price if you did not use any insurance.  - You can print the associated coupon and take it with your prescription to the pharmacy.  - You may also stop by our office during regular business hours and pick up a GoodRx coupon card.  - If you need your prescription sent electronically to a different pharmacy, notify our office through [facility] MyChart or by phone at [phone] option 4.     Si Usted Necesita Algo Despus de Su Visita  Tambin puede  enviarnos un mensaje a travs de MyChart. Por lo general respondemos a los mensajes de MyChart en el transcurso de 1 a 2 das hbiles.  Para renovar recetas, por favor pida a su farmacia que se ponga en contacto con nuestra oficina. Nuestro nmero de fax es el [phone].  Si tiene un asunto urgente cuando la clnica est cerrada y que no puede esperar hasta el siguiente da hbil, puede llamar/localizar a su doctor(a) al nmero que aparece a continuacin.   Por favor, tenga en cuenta que aunque hacemos todo lo posible para estar disponibles para asuntos urgentes fuera del horario de oficina, no estamos disponibles las 24 horas del da, los 7 das de la semana.   Si tiene un problema urgente y no puede comunicarse con nosotros, puede optar por buscar atencin mdica  en el consultorio de su doctor(a), en una clnica privada, en un centro de atencin urgente o en una sala de emergencias.  Si tiene una emergencia mdica, por favor llame inmediatamente al 911 o vaya a la sala de emergencias.  Nmeros de bper  - Dr. Kowalski: [phone]  - Dra. Moye: [phone]  - Dra. Stewart: [phone]  En caso de inclemencias del tiempo, por favor llame a nuestra lnea principal al [phone] para una actualizacin sobre el estado de cualquier retraso o cierre.  Consejos para la medicacin en dermatologa: Por favor, guarde las cajas en las que vienen los medicamentos de uso tpico para ayudarle a seguir las instrucciones sobre dnde y cmo usarlos. Las farmacias generalmente imprimen las instrucciones del medicamento slo en las cajas y no directamente en los tubos del medicamento.   Si su medicamento es muy caro, por favor, pngase en contacto con nuestra oficina llamando al [phone] y presione la opcin 4 o envenos un mensaje a travs de MyChart.   No podemos decirle cul ser su copago por los medicamentos por adelantado ya que esto es diferente dependiendo de la cobertura de su seguro.  Sin embargo, es posible que podamos encontrar un medicamento sustituto a menor costo o llenar un formulario para que el seguro cubra el medicamento que se considera necesario.   Si se requiere una autorizacin previa para que su compaa de seguros cubra su medicamento, por favor permtanos de 1 a 2 das hbiles para completar este proceso.  Los precios de los medicamentos varan con frecuencia dependiendo del lugar de dnde se surte la receta y alguna farmacias pueden ofrecer precios ms baratos.  El sitio web www.goodrx.com tiene cupones para medicamentos de diferentes farmacias. Los precios aqu no tienen en cuenta lo que podra costar con la ayuda del seguro (puede ser ms barato con su seguro), pero el sitio web puede darle el precio si no utiliz ningn seguro.  - Puede imprimir el cupn correspondiente y llevarlo con su receta a la farmacia.  - Tambin puede pasar por nuestra oficina durante el horario de atencin regular y recoger una tarjeta de cupones de GoodRx.  -   Si necesita que su receta se enve electrnicamente a una farmacia diferente, informe a nuestra oficina a travs de MyChart de [facility] o por telfono llamando al [phone] y presione la opcin 4.  

## 2022-05-02 ENCOUNTER — Encounter: Payer: Self-pay | Admitting: Dermatology

## 2022-12-01 IMAGING — US US EXTREM UP*L* LTD
1 series · 14 of 20 positions shown · non-contrast
Comparison: CTA chest 10/13/2017

CLINICAL DATA: Left axillary mass causing pain for 2 months. The
patient reports the sonographer the lump is gone but there is more
tenderness now. First noticed after having the flu.

EXAM:
ULTRASOUND LEFT UPPER EXTREMITY LIMITED
TECHNIQUE: Ultrasound examination of the upper extremity soft tissues was
performed in the area of clinical concern.

[Series 1: us extrem up*left* ltd · 0.07mm/px · 20 acquisitions, 14 frames shown]
[im 1/20]
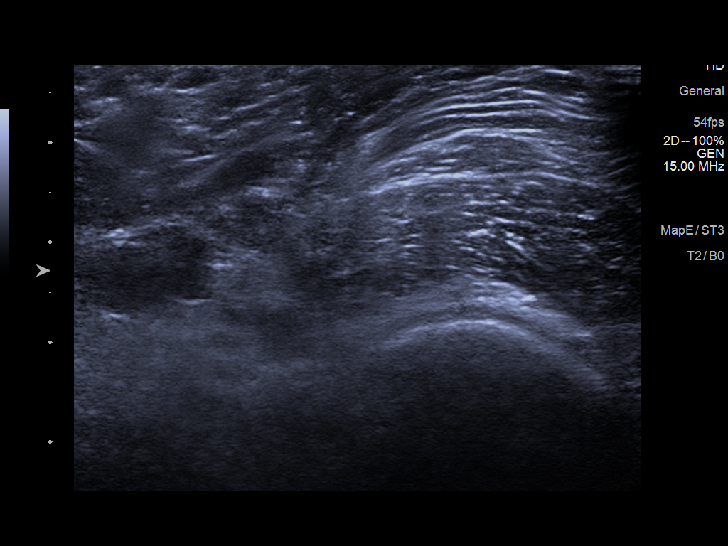
[im 3/20]
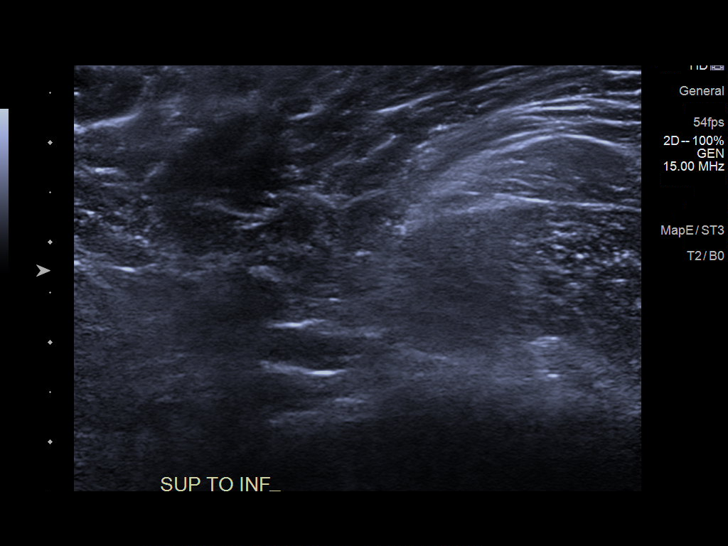
[im 4/20]
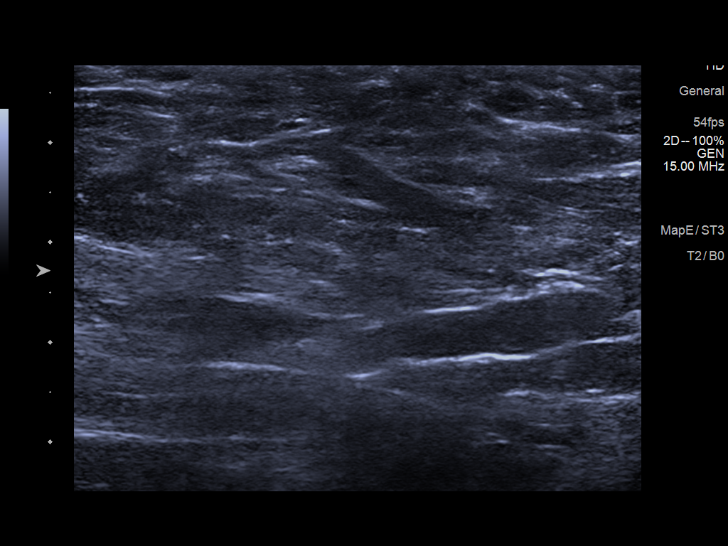
[im 6/20]
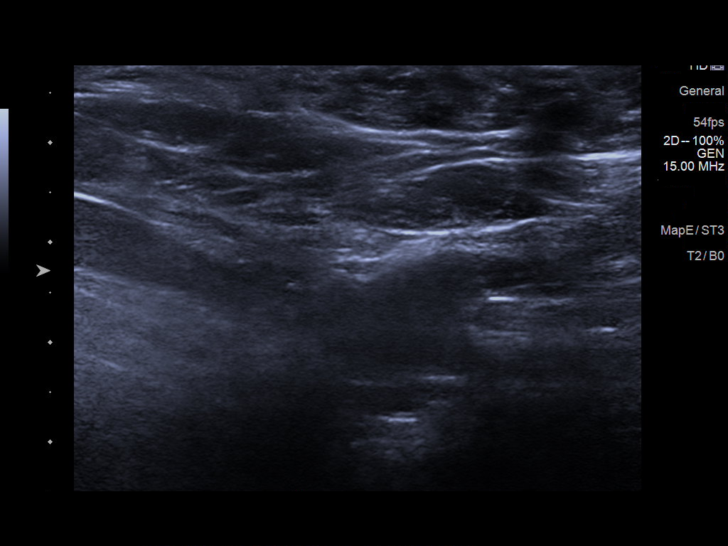
[im 7/20]
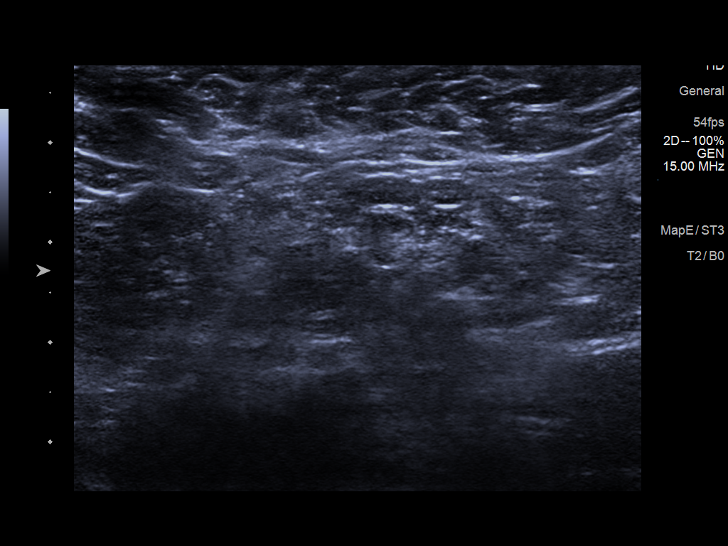
[im 8/20]
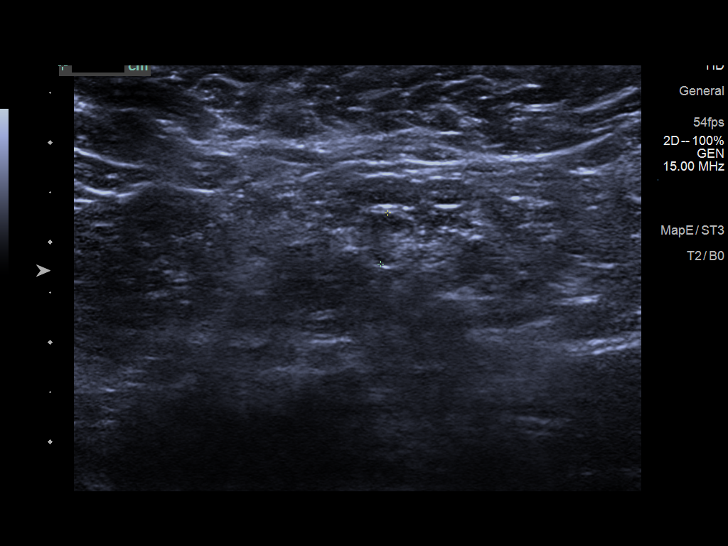
[im 10/20]
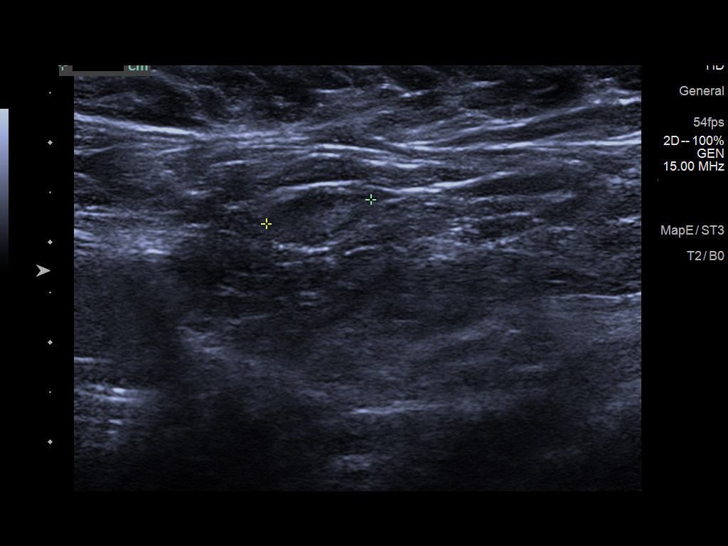
[im 11/20]
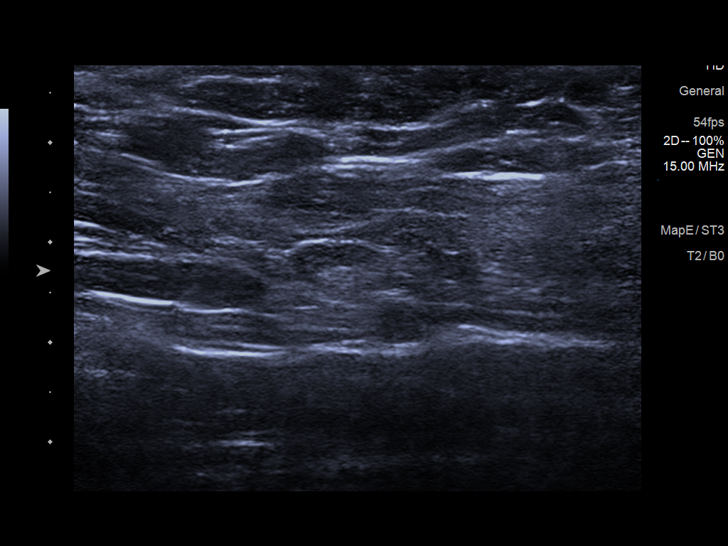
[im 13/20]
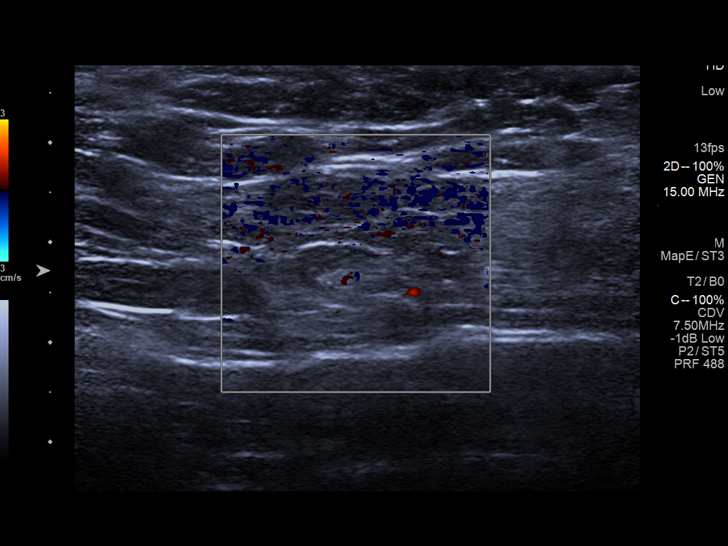
[im 14/20]
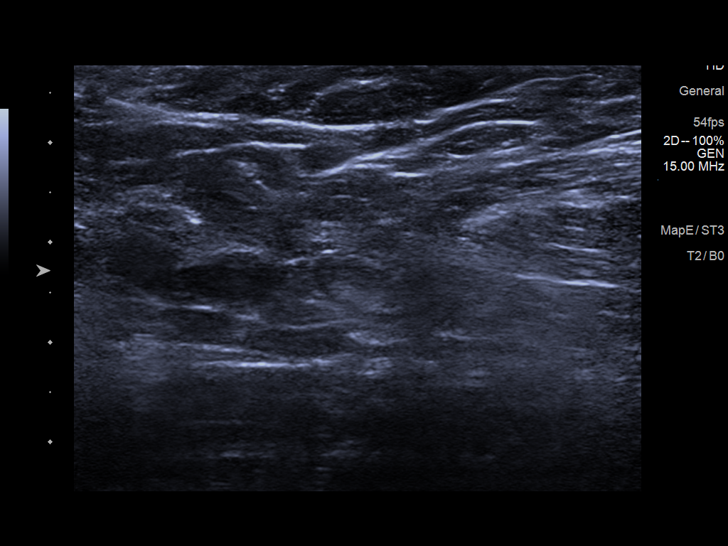
[im 16/20]
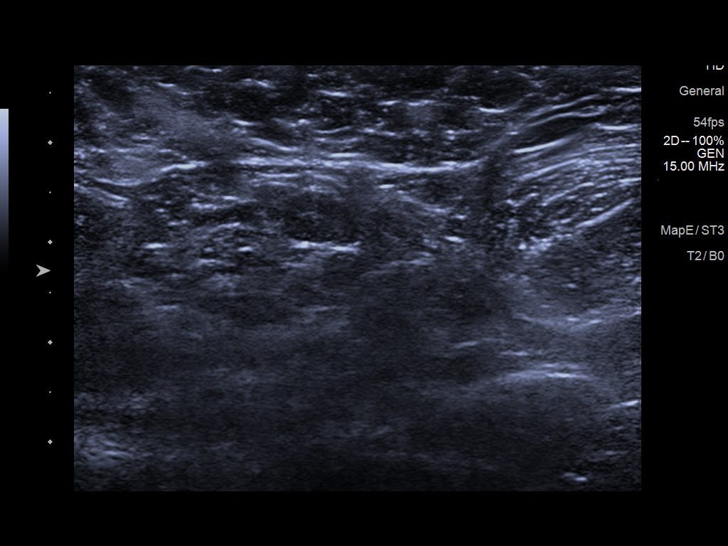
[im 17/20]
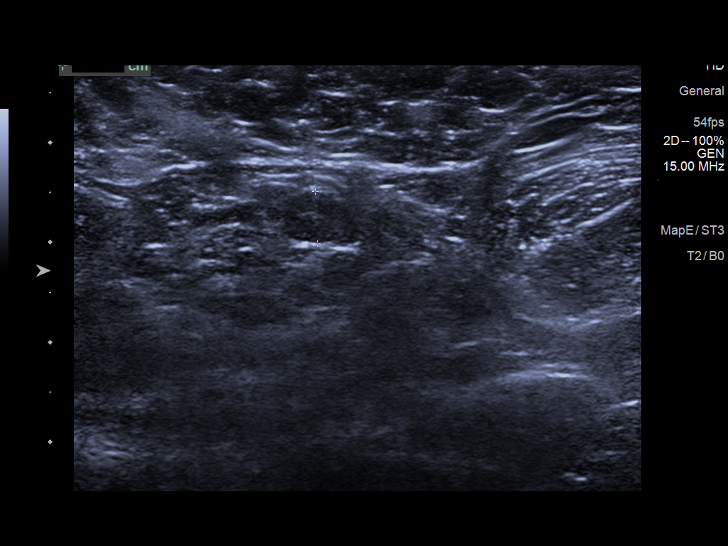
[im 18/20]
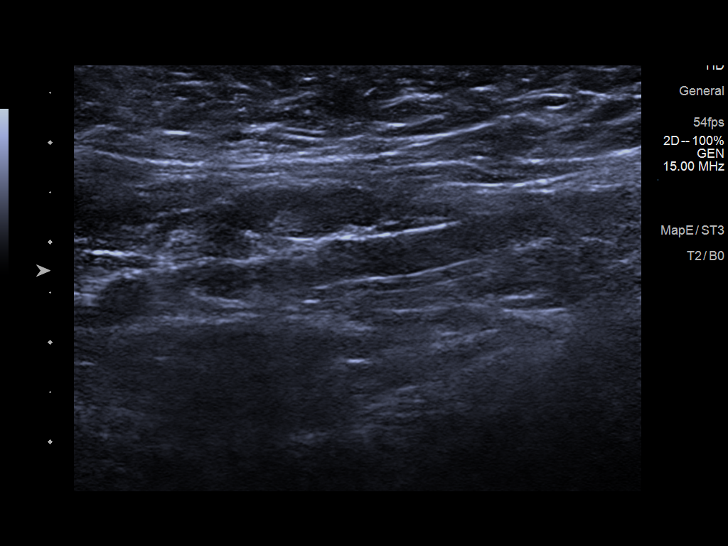
[im 20/20]
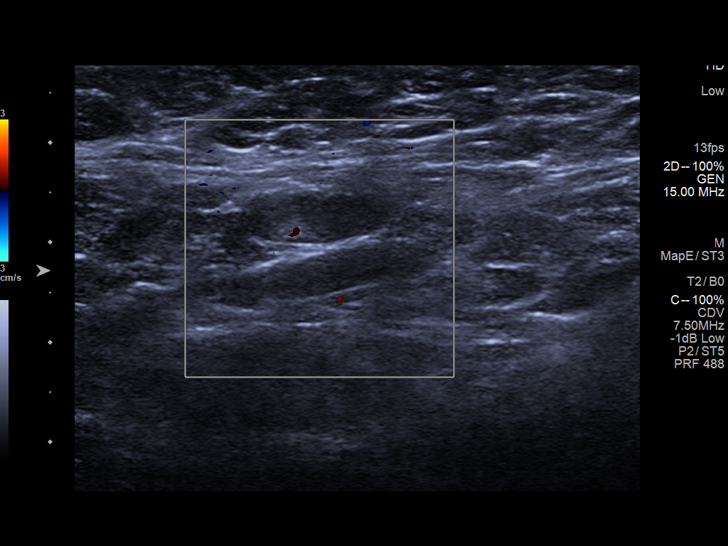

[14 of 20 positions shown; findings below may reference images not displayed]

FINDINGS: In the area of interest of the left axilla, a normal lymph node
measuring 5 mm in short axis with normal thin cortices and fatty
hilum is seen. No abnormally enlarged lymph node is seen. No
suspicious soft tissue mass or fluid collection.
IMPRESSION: No suspicious mass or fluid collection is seen within the left
axilla area of patient's tenderness.

## 2023-04-28 ENCOUNTER — Encounter: Payer: Self-pay | Admitting: Dermatology

## 2023-04-28 ENCOUNTER — Ambulatory Visit: Payer: BC Managed Care – PPO | Admitting: Dermatology

## 2023-04-28 DIAGNOSIS — D224 Melanocytic nevi of scalp and neck: Secondary | ICD-10-CM

## 2023-04-28 DIAGNOSIS — D492 Neoplasm of unspecified behavior of bone, soft tissue, and skin: Secondary | ICD-10-CM

## 2023-04-28 DIAGNOSIS — Z1283 Encounter for screening for malignant neoplasm of skin: Secondary | ICD-10-CM

## 2023-04-28 DIAGNOSIS — W908XXA Exposure to other nonionizing radiation, initial encounter: Secondary | ICD-10-CM

## 2023-04-28 DIAGNOSIS — D2271 Melanocytic nevi of right lower limb, including hip: Secondary | ICD-10-CM

## 2023-04-28 DIAGNOSIS — C4491 Basal cell carcinoma of skin, unspecified: Secondary | ICD-10-CM

## 2023-04-28 DIAGNOSIS — D1801 Hemangioma of skin and subcutaneous tissue: Secondary | ICD-10-CM

## 2023-04-28 DIAGNOSIS — L814 Other melanin hyperpigmentation: Secondary | ICD-10-CM

## 2023-04-28 DIAGNOSIS — C44319 Basal cell carcinoma of skin of other parts of face: Secondary | ICD-10-CM

## 2023-04-28 DIAGNOSIS — Z86018 Personal history of other benign neoplasm: Secondary | ICD-10-CM

## 2023-04-28 DIAGNOSIS — L821 Other seborrheic keratosis: Secondary | ICD-10-CM

## 2023-04-28 DIAGNOSIS — D229 Melanocytic nevi, unspecified: Secondary | ICD-10-CM

## 2023-04-28 DIAGNOSIS — L578 Other skin changes due to chronic exposure to nonionizing radiation: Secondary | ICD-10-CM | POA: Diagnosis not present

## 2023-04-28 HISTORY — DX: Basal cell carcinoma of skin, unspecified: C44.91

## 2023-04-28 NOTE — Patient Instructions (Addendum)
Wound Care Instructions  Cleanse wound gently with soap and water once a day then pat dry with clean gauze. Apply a thin coat of Petrolatum (petroleum jelly, "Vaseline") over the wound (unless you have an allergy to this). We recommend that you use a new, sterile tube of Vaseline. Do not pick or remove scabs. Do not remove the yellow or white "healing tissue" from the base of the wound.  Cover the wound with fresh, clean, nonstick gauze and secure with paper tape. You may use Band-Aids in place of gauze and tape if the wound is small enough, but would recommend trimming much of the tape off as there is often too much. Sometimes Band-Aids can irritate the skin.  You should call the office for your biopsy report after 1 week if you have not already been contacted.  If you experience any problems, such as abnormal amounts of bleeding, swelling, significant bruising, significant pain, or evidence of infection, please call the office immediately.  FOR ADULT SURGERY PATIENTS: If you need something for pain relief you may take 1 extra strength Tylenol (acetaminophen) AND 2 Ibuprofen (200mg  each) together every 4 hours as needed for pain. (do not take these if you are allergic to them or if you have a reason you should not take them.) Typically, you may only need pain medication for 1 to 3 days.       Recommend daily broad spectrum sunscreen SPF 30+ to sun-exposed areas, reapply every 2 hours as needed. Call for new or changing lesions.  Staying in the shade or wearing long sleeves, sun glasses (UVA+UVB protection) and wide brim hats (4-inch brim around the entire circumference of the hat) are also recommended for sun protection.    Melanoma ABCDEs  Melanoma is the most dangerous type of skin cancer, and is the leading cause of death from skin disease.  You are more likely to develop melanoma if you: Have light-colored skin, light-colored eyes, or red or blond hair Spend a lot of time in the sun Tan  regularly, either outdoors or in a tanning bed Have had blistering sunburns, especially during childhood Have a close family member who has had a melanoma Have atypical moles or large birthmarks  Early detection of melanoma is key since treatment is typically straightforward and cure rates are extremely high if we catch it early.   The first sign of melanoma is often a change in a mole or a new dark spot.  The ABCDE system is a way of remembering the signs of melanoma.  A for asymmetry:  The two halves do not match. B for border:  The edges of the growth are irregular. C for color:  A mixture of colors are present instead of an even brown color. D for diameter:  Melanomas are usually (but not always) greater than 6mm - the size of a pencil eraser. E for evolution:  The spot keeps changing in size, shape, and color.  Please check your skin once per month between visits. You can use a small mirror in front and a large mirror behind you to keep an eye on the back side or your body.   If you see any new or changing lesions before your next follow-up, please call to schedule a visit.  Please continue daily skin protection including broad spectrum sunscreen SPF 30+ to sun-exposed areas, reapplying every 2 hours as needed when you're outdoors.   Staying in the shade or wearing long sleeves, sun glasses (UVA+UVB protection) and  wide brim hats (4-inch brim around the entire circumference of the hat) are also recommended for sun protection.     Due to recent changes in healthcare laws, you may see results of your pathology and/or laboratory studies on MyChart before the doctors have had a chance to review them. We understand that in some cases there may be results that are confusing or concerning to you. Please understand that not all results are received at the same time and often the doctors may need to interpret multiple results in order to provide you with the best plan of care or course of  treatment. Therefore, we ask that you please give [place] 2 business days to thoroughly review all your results before contacting the office for clarification. Should we see a critical lab result, you will be contacted sooner.   If You Need Anything After Your Visit  If you have any questions or concerns for your doctor, please call our main line at [phone] and press option 4 to reach your doctor's medical assistant. If no one answers, please leave a voicemail as directed and we will return your call as soon as possible. Messages left after 4 pm will be answered the following business day.   You may also send [place] a message via MyChart. We typically respond to MyChart messages within 1-2 business days.  For prescription refills, please ask your pharmacy to contact our office. Our fax number is [phone].  If you have an urgent issue when the clinic is closed that cannot wait until the next business day, you can page your doctor at the number below.    Please note that while we do our best to be available for urgent issues outside of office hours, we are not available 24/7.   If you have an urgent issue and are unable to reach [place], you may choose to seek medical care at your doctor's office, retail clinic, urgent care center, or emergency room.  If you have a medical emergency, please immediately call 911 or go to the emergency department.  Pager Numbers  - Dr. Gwen Pounds: [phone]  - Dr. Roseanne Reno: [phone]  - Dr. Katrinka Blazing: [phone]   In the event of inclement weather, please call our main line at [phone] for an update on the status of any delays or closures.  Dermatology Medication Tips: Please keep the boxes that topical medications come in in order to help keep track of the instructions about where and how to use these. Pharmacies typically print the medication instructions only on the boxes and not directly on the medication tubes.   If your medication is too expensive,  please contact our office at [phone] option 4 or send [place] a message through MyChart.   We are unable to tell what your co-pay for medications will be in advance as this is different depending on your insurance coverage. However, we may be able to find a substitute medication at lower cost or fill out paperwork to get insurance to cover a needed medication.   If a prior authorization is required to get your medication covered by your insurance company, please allow [place] 1-2 business days to complete this process.  Drug prices often vary depending on where the prescription is filled and some pharmacies may offer cheaper prices.  The website www.goodrx.com contains coupons for medications through different pharmacies. The prices here do not account for what the cost may be with help from insurance (it may be cheaper with your insurance), but the website  can give you the price if you did not use any insurance.  - You can print the associated coupon and take it with your prescription to the pharmacy.  - You may also stop by our office during regular business hours and pick up a GoodRx coupon card.  - If you need your prescription sent electronically to a different pharmacy, notify our office through [facility] or by phone at [phone] option 4.     Si Usted Necesita Algo Despus de Su Visita  Tambin puede enviarnos un mensaje a travs de Clinical cytogeneticist. Por lo general respondemos a los mensajes de MyChart en el transcurso de 1 a 2 das hbiles.  Para renovar recetas, por favor pida a su farmacia que se ponga en contacto con nuestra oficina. Annie Sable de fax es [place] [phone].  Si tiene un asunto urgente cuando la clnica est cerrada y que no puede esperar hasta el siguiente da hbil, puede llamar/localizar a su doctor(a) al nmero que aparece a continuacin.   Por favor, tenga en cuenta que aunque hacemos todo lo posible para estar disponibles para asuntos urgentes fuera del  horario de [place], no estamos disponibles las 24 horas del da, los 7 [address]  [address] de la [place].   Si tiene un problema urgente y no puede comunicarse con nosotros, puede optar por buscar atencin mdica  en el consultorio de su doctor(a), en una clnica privada, en un centro de atencin urgente o en una sala de emergencias.  Si tiene Engineer, drilling, por favor llame inmediatamente al 911 o vaya a la sala de emergencias.  Nmeros de bper  - Dr. Gwen Pounds: [phone]  - Dra. Roseanne Reno: [phone]  - Dr. Katrinka Blazing: [phone]   En caso de inclemencias del tiempo, por favor llame a Lacy Duverney principal al [phone] para una actualizacin sobre el [place] de cualquier retraso o cierre.  Consejos para la medicacin en dermatologa: Por favor, guarde las cajas en las que vienen los medicamentos de uso tpico para ayudarle a seguir las instrucciones sobre dnde y cmo usarlos. Las farmacias generalmente imprimen las instrucciones del medicamento slo en las cajas y no directamente en los tubos del [place].   Si su medicamento es muy caro, por favor, pngase en contacto con Rolm Gala llamando al [phone] y presione la opcin 4 o envenos un mensaje a travs de Clinical cytogeneticist.   No podemos decirle cul ser su copago por los medicamentos por adelantado ya que esto es diferente dependiendo de la cobertura de su seguro. Sin embargo, es posible que podamos encontrar un medicamento sustituto a Audiological scientist un formulario para que el seguro cubra el medicamento que se considera necesario.   Si se requiere una autorizacin previa para que su compaa de seguros [place] su medicamento, por favor permtanos de 1 a 2 das hbiles para completar [address]  Los precios de los medicamentos varan con frecuencia dependiendo del Environmental consultant de dnde se surte la receta y alguna farmacias pueden ofrecer precios ms baratos.  El sitio web www.goodrx.com tiene cupones para medicamentos de Engineer, civil (consulting). Los precios aqu no tienen en cuenta lo que podra costar con la ayuda del seguro (puede ser ms barato con su seguro), pero el sitio web puede darle el precio si no utiliz Tourist information centre manager.  - Puede imprimir el cupn correspondiente y llevarlo con su receta a la farmacia.  - Tambin puede pasar por nuestra oficina durante el horario de atencin regular y Education officer, museum una tarjeta de cupones de GoodRx.  -  Si necesita que su receta se enve electrnicamente a Psychiatrist, informe a nuestra oficina a travs de MyChart de [place] o por telfono llamando al [phone] y presione la opcin 4.

## 2023-04-28 NOTE — Progress Notes (Signed)
Follow-Up Visit   Subjective  Kathleen Marsh is a 37 y.o. female who presents for the following: Skin Cancer Screening and Full Body Skin Exam. Hx of dysplastic nevi   Pink spot at right upper forehead at hair line. Dur: 6 months. Looks pink and scaly. Will not go away. Non tender. Denies bleeding. Had a scab at one time.   Spot on right dorsal hand. Gets scabby and raised at times. Dur: ~1 year  The patient presents for Total-Body Skin Exam (TBSE) for skin cancer screening and mole check. The patient has spots, moles and lesions to be evaluated, some may be new or changing and the patient may have concern these could be cancer.    The following portions of the chart were reviewed this encounter and updated as appropriate: medications, allergies, medical history  Review of Systems:  No other skin or systemic complaints except as noted in HPI or Assessment and Plan.  Objective  Well appearing patient in no apparent distress; mood and affect are within normal limits.  A full examination was performed including scalp, head, eyes, ears, nose, lips, neck, chest, axillae, abdomen, back, buttocks, bilateral upper extremities, bilateral lower extremities, hands, feet, fingers, toes, fingernails, and toenails. All findings within normal limits unless otherwise noted below.   Relevant physical exam findings are noted in the Assessment and Plan.  Right Superior Forehead at hair line 1 x 1 cm irregular textured patch       Left Posterior Neck 0.6 cm tan fleshy papule       Right Posterior Neck 0.3 cm tan fleshy papule       Right 5th Lateral Toe 0.3 cm dark brown macule         Assessment & Plan   SKIN CANCER SCREENING PERFORMED TODAY.  ACTINIC DAMAGE - Chronic condition, secondary to cumulative UV/sun exposure - diffuse scaly erythematous macules with underlying dyspigmentation - Recommend daily broad spectrum sunscreen SPF 30+ to sun-exposed areas,  reapply every 2 hours as needed.  - Staying in the shade or wearing long sleeves, sun glasses (UVA+UVB protection) and wide brim hats (4-inch brim around the entire circumference of the hat) are also recommended for sun protection.  - Call for new or changing lesions.  LENTIGINES, SEBORRHEIC KERATOSES, HEMANGIOMAS - Benign normal skin lesions - Benign-appearing - Call for any changes  MELANOCYTIC NEVI - Tan-brown and/or pink-flesh-colored symmetric macules and papules - Benign appearing on exam today - Observation - Call clinic for new or changing moles - Recommend daily use of broad spectrum spf 30+ sunscreen to sun-exposed areas.       Neoplasm of skin (4) Right Superior Forehead at hair line  Skin / nail biopsy Type of biopsy: tangential   Informed consent: discussed and consent obtained   Timeout: patient name, date of birth, surgical site, and procedure verified   Procedure prep:  Patient was prepped and draped in usual sterile fashion Prep type:  Isopropyl alcohol Anesthesia: the lesion was anesthetized in a standard fashion   Anesthetic:  1% lidocaine w/ epinephrine 1-100,000 buffered w/ 8.4% NaHCO3 Instrument used: flexible razor blade   Hemostasis achieved with: pressure, aluminum chloride and electrodesiccation   Outcome: patient tolerated procedure well   Post-procedure details: sterile dressing applied and wound care instructions given   Dressing type: bandage and petrolatum    Specimen 1 - Surgical pathology Differential Diagnosis: R/O BCC  Check Margins: No  Left Posterior Neck  Epidermal / dermal shaving  Lesion diameter (  cm):  0.6 Informed consent: discussed and consent obtained   Timeout: patient name, date of birth, surgical site, and procedure verified   Procedure prep:  Patient was prepped and draped in usual sterile fashion Prep type:  Isopropyl alcohol Anesthesia: the lesion was anesthetized in a standard fashion   Anesthetic:  1% lidocaine w/  epinephrine 1-100,000 buffered w/ 8.4% NaHCO3 Instrument used: flexible razor blade   Hemostasis achieved with: pressure, aluminum chloride and electrodesiccation   Outcome: patient tolerated procedure well   Post-procedure details: sterile dressing applied and wound care instructions given   Dressing type: bandage and petrolatum    Specimen 2 - Surgical pathology Differential Diagnosis: Irritated nevus, R/O dysplasia  Check Margins: Yes  Right Posterior Neck  Epidermal / dermal shaving  Lesion diameter (cm):  0.3 Informed consent: discussed and consent obtained   Timeout: patient name, date of birth, surgical site, and procedure verified   Procedure prep:  Patient was prepped and draped in usual sterile fashion Prep type:  Isopropyl alcohol Anesthesia: the lesion was anesthetized in a standard fashion   Anesthetic:  1% lidocaine w/ epinephrine 1-100,000 buffered w/ 8.4% NaHCO3 Instrument used: flexible razor blade   Hemostasis achieved with: pressure, aluminum chloride and electrodesiccation   Outcome: patient tolerated procedure well   Post-procedure details: sterile dressing applied and wound care instructions given   Dressing type: bandage and petrolatum    Specimen 3 - Surgical pathology Differential Diagnosis:  Irritated nevus, R/O dysplasia  Check Margins: Yes  Right 5th Lateral Toe  Epidermal / dermal shaving  Lesion diameter (cm):  0.3 Informed consent: discussed and consent obtained   Timeout: patient name, date of birth, surgical site, and procedure verified   Procedure prep:  Patient was prepped and draped in usual sterile fashion Prep type:  Isopropyl alcohol Anesthesia: the lesion was anesthetized in a standard fashion   Anesthetic:  1% lidocaine w/ epinephrine 1-100,000 buffered w/ 8.4% NaHCO3 Instrument used: flexible razor blade   Hemostasis achieved with: pressure, aluminum chloride and electrodesiccation   Outcome: patient tolerated procedure well    Post-procedure details: sterile dressing applied and wound care instructions given   Dressing type: bandage and petrolatum    Specimen 4 - Surgical pathology Differential Diagnosis: R/O dysplastic nevus  Check Margins: Yes   Return in about 1 year (around 04/27/2024) for TBSE, HxDN.  I, Lawson Radar, CMA, am acting as scribe for Armida Sans, MD.   Documentation: I have reviewed the above documentation for accuracy and completeness, and I agree with the above.  Armida Sans, MD

## 2023-05-04 LAB — SURGICAL PATHOLOGY

## 2023-05-05 ENCOUNTER — Telehealth: Payer: Self-pay

## 2023-05-05 DIAGNOSIS — C44319 Basal cell carcinoma of skin of other parts of face: Secondary | ICD-10-CM

## 2023-05-05 NOTE — Telephone Encounter (Signed)
-----   Message from Armida Sans sent at 05/04/2023  6:13 PM EDT ----- FINAL DIAGNOSIS        1. Skin, right superior forehead at hair line :       BASAL CELL CARCINOMA, SUPERFICIAL AND NODULAR PATTERNS, PERIPHERAL MARGIN       INVOLVED        2. Skin, left posterior neck :       MELANOCYTIC NEVUS, INTRADERMAL TYPE        3. Skin, right posterior neck :       MELANOCYTIC NEVUS, INTRADERMAL TYPE        4. Skin, right 5th lateral toe :       DYSPLASTIC JUNCTIONAL NEVUS WITH MODERATE ATYPIA, CLOSE TO MARGIN   1- Cancer = BCC Schedule for MOHS If pt wants to discuss with me, I can call her or she can come to office to discuss - please make pt appt if needed 2 & 3 - both benign moles No further treatment needed 3- Moderate dysplastic Recheck next visit

## 2023-05-05 NOTE — Telephone Encounter (Signed)
Patient advised of BX results and would like referral to Dr. Adriana Simas.  Referral sent.   Patient did notice that some of the mole remains at her right toe. She is concerned if this is okay?

## 2023-05-11 ENCOUNTER — Encounter: Payer: Self-pay | Admitting: Dermatology

## 2024-04-27 ENCOUNTER — Ambulatory Visit: Payer: BC Managed Care – PPO | Admitting: Dermatology

## 2024-05-01 ENCOUNTER — Ambulatory Visit: Admitting: Dermatology

## 2024-05-01 ENCOUNTER — Encounter: Payer: Self-pay | Admitting: Dermatology

## 2024-05-01 DIAGNOSIS — L821 Other seborrheic keratosis: Secondary | ICD-10-CM

## 2024-05-01 DIAGNOSIS — D2361 Other benign neoplasm of skin of right upper limb, including shoulder: Secondary | ICD-10-CM

## 2024-05-01 DIAGNOSIS — Z7189 Other specified counseling: Secondary | ICD-10-CM

## 2024-05-01 DIAGNOSIS — Z1283 Encounter for screening for malignant neoplasm of skin: Secondary | ICD-10-CM

## 2024-05-01 DIAGNOSIS — L814 Other melanin hyperpigmentation: Secondary | ICD-10-CM

## 2024-05-01 DIAGNOSIS — D489 Neoplasm of uncertain behavior, unspecified: Secondary | ICD-10-CM | POA: Diagnosis not present

## 2024-05-01 DIAGNOSIS — D2262 Melanocytic nevi of left upper limb, including shoulder: Secondary | ICD-10-CM | POA: Diagnosis not present

## 2024-05-01 DIAGNOSIS — W908XXA Exposure to other nonionizing radiation, initial encounter: Secondary | ICD-10-CM

## 2024-05-01 DIAGNOSIS — L578 Other skin changes due to chronic exposure to nonionizing radiation: Secondary | ICD-10-CM | POA: Diagnosis not present

## 2024-05-01 DIAGNOSIS — Z86018 Personal history of other benign neoplasm: Secondary | ICD-10-CM

## 2024-05-01 DIAGNOSIS — D239 Other benign neoplasm of skin, unspecified: Secondary | ICD-10-CM

## 2024-05-01 DIAGNOSIS — Z85828 Personal history of other malignant neoplasm of skin: Secondary | ICD-10-CM

## 2024-05-01 DIAGNOSIS — Z85820 Personal history of malignant melanoma of skin: Secondary | ICD-10-CM

## 2024-05-01 DIAGNOSIS — D229 Melanocytic nevi, unspecified: Secondary | ICD-10-CM

## 2024-05-01 NOTE — Patient Instructions (Addendum)
 Biopsy Wound Care Instructions  Leave the original bandage on for 24 hours if possible.  If the bandage becomes soaked or soiled before that time, it is OK to remove it and examine the wound.  A small amount of post-operative bleeding is normal.  If excessive bleeding occurs, remove the bandage, place gauze over the site and apply continuous pressure (no peeking) over the area for 30 minutes. If this does not work, please call our clinic as soon as possible or page your doctor if it is after hours.   Once a day, cleanse the wound with soap and water. It is fine to shower. If a thick crust develops you may use a Q-tip dipped into dilute hydrogen peroxide (mix 1:1 with water) to dissolve it.  Hydrogen peroxide can slow the healing process, so use it only as needed.    After washing, apply petroleum jelly (Vaseline) or an antibiotic ointment if your doctor prescribed one for you, followed by a bandage.    For best healing, the wound should be covered with a layer of ointment at all times. If you are not able to keep the area covered with a bandage to hold the ointment in place, this may mean re-applying the ointment several times a day.  Continue this wound care until the wound has healed and is no longer open.   Itching and mild discomfort is normal during the healing process. However, if you develop pain or severe itching, please call our office.   If you have any discomfort, you can take Tylenol (acetaminophen) or ibuprofen as directed on the bottle. (Please do not take these if you have an allergy to them or cannot take them for another reason).  Some redness, tenderness and white or yellow material in the wound is normal healing.  If the area becomes very sore and red, or develops a thick yellow-green material (pus), it may be infected; please notify us .    If you have stitches, return to clinic as directed to have the stitches removed. You will continue wound care for 2-3 days after the stitches  are removed.   Wound healing continues for up to one year following surgery. It is not unusual to experience pain in the scar from time to time during the interval.  If the pain becomes severe or the scar thickens, you should notify the office.    A slight amount of redness in a scar is expected for the first six months.  After six months, the redness will fade and the scar will soften and fade.  The color difference becomes less noticeable with time.  If there are any problems, return for a post-op surgery check at your earliest convenience.  To improve the appearance of the scar, you can use silicone scar gel, cream, or sheets (such as Mederma or Serica) every night for up to one year. These are available over the counter (without a prescription).  Please call our office at [phone] for any questions or concerns.       Melanoma ABCDEs  Melanoma is the most dangerous type of skin cancer, and is the leading cause of death from skin disease.  You are more likely to develop melanoma if you: Have light-colored skin, light-colored eyes, or red or blond hair Spend a lot of time in the sun Tan regularly, either outdoors or in a tanning bed Have had blistering sunburns, especially during childhood Have a close family member who has had a melanoma Have atypical moles  or large birthmarks  Early detection of melanoma is key since treatment is typically straightforward and cure rates are extremely high if we catch it early.   The first sign of melanoma is often a change in a mole or a new dark spot.  The ABCDE system is a way of remembering the signs of melanoma.  A for asymmetry:  The two halves do not match. B for border:  The edges of the growth are irregular. C for color:  A mixture of colors are present instead of an even brown color. D for diameter:  Melanomas are usually (but not always) greater than 6mm - the size of a pencil eraser. E for evolution:  The spot keeps changing in  size, shape, and color.  Please check your skin once per month between visits. You can use a small mirror in front and a large mirror behind you to keep an eye on the back side or your body.   If you see any new or changing lesions before your next follow-up, please call to schedule a visit.  Please continue daily skin protection including broad spectrum sunscreen SPF 30+ to sun-exposed areas, reapplying every 2 hours as needed when you're outdoors.   Staying in the shade or wearing long sleeves, sun glasses (UVA+UVB protection) and wide brim hats (4-inch brim around the entire circumference of the hat) are also recommended for sun protection.     Due to recent changes in healthcare laws, you may see results of your pathology and/or laboratory studies on MyChart before the doctors have had a chance to review them. We understand that in some cases there may be results that are confusing or concerning to you. Please understand that not all results are received at the same time and often the doctors may need to interpret multiple results in order to provide you with the best plan of care or course of treatment. Therefore, we ask that you please give us  2 business days to thoroughly review all your results before contacting the office for clarification. Should we see a critical lab result, you will be contacted sooner.   If You Need Anything After Your Visit  If you have any questions or concerns for your doctor, please call our main line at [phone] and press option 4 to reach your doctor's medical assistant. If no one answers, please leave a voicemail as directed and we will return your call as soon as possible. Messages left after 4 pm will be answered the following business day.   You may also send us  a message via MyChart. We typically respond to MyChart messages within 1-2 business days.  For prescription refills, please ask your pharmacy to contact our office. Our fax number is  [phone].  If you have an urgent issue when the clinic is closed that cannot wait until the next business day, you can page your doctor at the number below.    Please note that while we do our best to be available for urgent issues outside of office hours, we are not available 24/7.   If you have an urgent issue and are unable to reach us , you may choose to seek medical care at your doctor's office, retail clinic, urgent care center, or emergency room.  If you have a medical emergency, please immediately call 911 or go to the emergency department.  Pager Numbers  - Dr. Hester: [phone]  - Dr. Jackquline: [phone]  - Dr. Claudene: [phone]   - Dr. Raymund:  [phone]  In the event of inclement weather, please call our main line at [phone] for an update on the status of any delays or closures.  Dermatology Medication Tips: Please keep the boxes that topical medications come in in order to help keep track of the instructions about where and how to use these. Pharmacies typically print the medication instructions only on the boxes and not directly on the medication tubes.   If your medication is too expensive, please contact our office at [phone] option 4 or send us  a message through MyChart.   We are unable to tell what your co-pay for medications will be in advance as this is different depending on your insurance coverage. However, we may be able to find a substitute medication at lower cost or fill out paperwork to get insurance to cover a needed medication.   If a prior authorization is required to get your medication covered by your insurance company, please allow us  1-2 business days to complete this process.  Drug prices often vary depending on where the prescription is filled and some pharmacies may offer cheaper prices.  The website www.goodrx.com contains coupons for medications through different pharmacies. The prices here do not account for what the cost  may be with help from insurance (it may be cheaper with your insurance), but the website can give you the price if you did not use any insurance.  - You can print the associated coupon and take it with your prescription to the pharmacy.  - You may also stop by our office during regular business hours and pick up a GoodRx coupon card.  - If you need your prescription sent electronically to a different pharmacy, notify our office through [facility] or by phone at [phone] option 4.     Si Usted Necesita Algo Despus de Su Visita  Tambin puede enviarnos un mensaje a travs de Clinical cytogeneticist. Por lo general respondemos a los mensajes de MyChart en el transcurso de 1 a 2 das hbiles.  Para renovar recetas, por favor pida a su farmacia que se ponga en contacto con nuestra oficina. Randi lakes de fax es [place] [phone].  Si tiene un asunto urgente cuando la clnica est cerrada y que no puede esperar hasta el siguiente da hbil, puede llamar/localizar a su doctor(a) al nmero que aparece a continuacin.   Por favor, tenga en cuenta que aunque hacemos todo lo posible para estar disponibles para asuntos urgentes fuera del horario de [place], no estamos disponibles las 24 horas del da, los 7 [address]  [address] de la [place].   Si tiene un problema urgente y no puede comunicarse con nosotros, puede optar por buscar atencin mdica  en el consultorio de su doctor(a), en una clnica privada, en un centro de atencin urgente o en una sala de emergencias.  Si tiene Engineer, drilling, por favor llame inmediatamente al 911 o vaya a la sala de emergencias.  Nmeros de bper  - Dr. Hester: [phone]  - Dra. Jackquline: [phone]  - Dr. Claudene: [phone]  - Dra. Kitts: [phone]  En caso de inclemencias del [place], por favor llame a nuestra lnea principal al [phone] para una actualizacin sobre el estado de cualquier retraso o cierre.  Consejos para la medicacin en dermatologa: Por  favor, guarde las cajas en las que vienen los medicamentos de uso tpico para ayudarle a seguir las instrucciones sobre dnde y cmo usarlos. Las farmacias generalmente imprimen las instrucciones del medicamento slo en las cajas y no directamente  en los tubos del medicamento.   Si su medicamento es muy caro, por favor, pngase en contacto con landry rieger llamando al [phone] y presione la opcin 4 o envenos un mensaje a travs de Clinical cytogeneticist.   No podemos decirle cul ser su copago por los medicamentos por adelantado ya que esto es diferente dependiendo de la cobertura de su seguro. Sin embargo, es posible que podamos encontrar un medicamento sustituto a Audiological scientist un formulario para que el seguro cubra el medicamento que se considera necesario.   Si se requiere una autorizacin previa para que su compaa de seguros malta su medicamento, por favor permtanos de 1 a 2 das hbiles para completar este proceso.  Los precios de los medicamentos varan con frecuencia dependiendo del Environmental consultant de dnde se surte la receta y alguna farmacias pueden ofrecer precios ms baratos.  El sitio web www.goodrx.com tiene cupones para medicamentos de Health and safety inspector. Los precios aqu no tienen en cuenta lo que podra costar con la ayuda del seguro (puede ser ms barato con su seguro), pero el sitio web puede darle el precio si no utiliz Tourist information centre manager.  - Puede imprimir el cupn correspondiente y llevarlo con su receta a la farmacia.  - Tambin puede pasar por nuestra oficina durante el horario de atencin regular y Education officer, museum una tarjeta de cupones de GoodRx.  - Si necesita que su receta se enve electrnicamente a una farmacia diferente, informe a nuestra oficina a travs de MyChart de [place] o por telfono llamando al [phone] y presione la opcin 4.

## 2024-05-01 NOTE — Progress Notes (Signed)
 Follow-Up Visit   Subjective  Kathleen Marsh is a 38 y.o. female who presents for the following: Skin Cancer Screening and Full Body Skin Exam Hx of bcc Hx of dysplastic nevi  The patient presents for Total-Body Skin Exam (TBSE) for skin cancer screening and mole check. The patient has spots, moles and lesions to be evaluated, some may be new or changing and the patient may have concern these could be cancer.  The following portions of the chart were reviewed this encounter and updated as appropriate: medications, allergies, medical history  Review of Systems:  No other skin or systemic complaints except as noted in HPI or Assessment and Plan.  Objective  Well appearing patient in no apparent distress; mood and affect are within normal limits.  A full examination was performed including scalp, head, eyes, ears, nose, lips, neck, chest, axillae, abdomen, back, buttocks, bilateral upper extremities, bilateral lower extremities, hands, feet, fingers, toes, fingernails, and toenails. All findings within normal limits unless otherwise noted below.   Relevant physical exam findings are noted in the Assessment and Plan.  left distal tricep near elbow 0.8 cm irregular brown macule     Assessment & Plan   HISTORY OF BASAL CELL CARCINOMA OF THE SKIN 04/28/2023 right superior forehead at hairline - Mohs 08/2023 done by Dr. Bluford  - No evidence of recurrence today - Recommend regular full body skin exams - Recommend daily broad spectrum sunscreen SPF 30+ to sun-exposed areas, reapply every 2 hours as needed.  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  Benign-appearing.  Observation.  Call clinic for new or changing lesions.  Recommend daily use of broad spectrum spf 30+ sunscreen to sun-exposed areas.    SKIN CANCER SCREENING PERFORMED TODAY.   ACTINIC DAMAGE - Chronic condition, secondary to cumulative UV/sun exposure - diffuse scaly erythematous macules with underlying dyspigmentation - Recommend daily broad spectrum sunscreen SPF 30+ to sun-exposed areas, reapply every 2 hours as needed.  - Staying in the shade or wearing long sleeves, sun glasses (UVA+UVB protection) and wide brim hats (4-inch brim around the entire circumference of the hat) are also recommended for sun protection.  - Call for new or changing lesions.  LENTIGINES, SEBORRHEIC KERATOSES, HEMANGIOMAS - Benign normal skin lesions - Benign-appearing - Call for any changes  MELANOCYTIC NEVI - Tan-brown and/or pink-flesh-colored symmetric macules and papules - Benign appearing on exam today - Observation - Call clinic for new or changing moles - Recommend daily use of broad spectrum spf 30+ sunscreen to sun-exposed areas.   DERMATOFIBROMA R Dorsum hand Exam:  Firm pink/brown papulenodule with dimple sign. Treatment Plan: A dermatofibroma is a benign growth possibly related to trauma, such as an insect bite, cut from shaving, or  inflamed acne-type bump.  Treatment options to remove include shave or excision with resulting scar and risk of recurrence.  Since benign-appearing and not bothersome, will observe for now.    NEOPLASM  OF UNCERTAIN BEHAVIOR left distal tricep near elbow Epidermal / dermal shaving  Lesion diameter (cm):  0.8 Informed consent: discussed and consent obtained   Timeout: patient name, date of birth, surgical site, and procedure verified   Procedure prep:  Patient was prepped and draped in usual sterile fashion Prep type:  Isopropyl alcohol Anesthesia: the lesion was anesthetized in a standard fashion   Anesthetic:  1% lidocaine  w/ epinephrine  1-100,000 buffered w/ 8.4% NaHCO3 Instrument used: flexible razor blade   Hemostasis achieved with: pressure, aluminum chloride and electrodesiccation   Outcome: patient tolerated procedure well   Post-procedure details: sterile dressing applied and wound care instructions given   Dressing type: bandage and petrolatum    Specimen 1 - Surgical pathology Differential Diagnosis: nevus r/o dysplasia   Check Margins: yes Nevus r/o dysplasia  Return in about 1 year (around 05/01/2025) for TBSE.  IEleanor Blush, CMA, am acting as scribe for Alm Rhyme, MD.   Documentation: I have reviewed the above documentation for accuracy and completeness, and I agree with the above.  Alm Rhyme, MD

## 2024-05-04 LAB — SURGICAL PATHOLOGY

## 2024-05-05 ENCOUNTER — Ambulatory Visit: Payer: Self-pay | Admitting: Dermatology

## 2024-05-08 NOTE — Telephone Encounter (Addendum)
 Called and discussed results with patient. She verbalized understanding and denied further questions.  ----- Message from Alm Rhyme sent at 05/05/2024  4:38 PM EDT ----- FINAL DIAGNOSIS        1. Skin, left distal tricep near elbow :       MELANOCYTIC NEVUS, INTRADERMAL TYPE, BASE INVOLVED    Benign mole No further treatment needed ----- Message ----- From: Interface, Lab In Three Zero One Sent: 05/04/2024   4:32 PM EDT To: Alm JAYSON Rhyme, MD

## 2024-05-11 ENCOUNTER — Encounter: Payer: Self-pay | Admitting: Dermatology

## 2024-05-11 MED ORDER — MUPIROCIN 2 % EX OINT: 1.0000 | TOPICAL_OINTMENT | Freq: Two times a day (BID) | CUTANEOUS | 0 refills | Status: AC

## 2024-05-16 ENCOUNTER — Ambulatory Visit: Admitting: Dermatology

## 2024-05-16 ENCOUNTER — Encounter: Payer: Self-pay | Admitting: Dermatology

## 2024-05-16 DIAGNOSIS — Z7189 Other specified counseling: Secondary | ICD-10-CM

## 2024-05-16 DIAGNOSIS — Z48817 Encounter for surgical aftercare following surgery on the skin and subcutaneous tissue: Secondary | ICD-10-CM

## 2024-05-16 NOTE — Patient Instructions (Signed)

## 2024-05-16 NOTE — Progress Notes (Signed)
   Follow-Up Visit   Subjective  Kathleen Marsh is a 38 y.o. female who presents for the following: recheck Benign nevus bx proven bx site, L distal tricep near elbow, pt currently using Mupirocin oint, painful  The following portions of the chart were reviewed this encounter and updated as appropriate: medications, allergies, medical history  Review of Systems:  No other skin or systemic complaints except as noted in HPI or Assessment and Plan.  Objective  Well appearing patient in no apparent distress; mood and affect are within normal limits.  A focused examination was performed of the following areas: Left arm  Relevant exam findings are noted in the Assessment and Plan.  L distal tricep near elbow       Assessment & Plan   BENIGN NEVUS with normal healing Bx proven L distal tricep near elbow Exam: normal healing bx site, see photo No evidence of infection.  Treatment Plan: Wound cleansed today with Puracyn followed by Mupiroicn and bandage.   Recommend wound care as above q 2 days Reassured today. Symptoms may just be nerve pain or healing symptoms.   Return for as scheduled for TBSE.  I, Grayce Saunas, RMA, am acting as scribe for Alm Rhyme, MD .   Documentation: I have reviewed the above documentation for accuracy and completeness, and I agree with the above.  Alm Rhyme, MD

## 2025-04-23 ENCOUNTER — Ambulatory Visit: Admitting: Dermatology

## 2025-05-01 ENCOUNTER — Ambulatory Visit: Admitting: Dermatology
